# Patient Record
Sex: Male | Born: 1970 | Race: White | Hispanic: Yes | Marital: Single | Smoking: Never smoker
Health system: Southern US, Community
[De-identification: ages and names within clinical notes are randomized; demographics above are authoritative.]

---

## 2002-04-28 ENCOUNTER — Emergency Department (HOSPITAL_COMMUNITY): Admission: EM | Admit: 2002-04-28 | Discharge: 2002-04-28 | Payer: Self-pay | Admitting: Emergency Medicine

## 2004-08-09 ENCOUNTER — Ambulatory Visit (HOSPITAL_COMMUNITY): Admission: RE | Admit: 2004-08-09 | Discharge: 2004-08-09 | Payer: Self-pay | Admitting: Internal Medicine

## 2005-08-22 ENCOUNTER — Ambulatory Visit (HOSPITAL_COMMUNITY): Admission: RE | Admit: 2005-08-22 | Discharge: 2005-08-22 | Payer: Self-pay | Admitting: Internal Medicine

## 2005-08-24 ENCOUNTER — Ambulatory Visit: Payer: Self-pay | Admitting: Gastroenterology

## 2007-07-04 ENCOUNTER — Ambulatory Visit (HOSPITAL_COMMUNITY): Admission: RE | Admit: 2007-07-04 | Discharge: 2007-07-04 | Payer: Self-pay | Admitting: Family Medicine

## 2007-07-22 ENCOUNTER — Ambulatory Visit (HOSPITAL_COMMUNITY): Admission: RE | Admit: 2007-07-22 | Discharge: 2007-07-22 | Payer: Self-pay | Admitting: Urology

## 2007-08-05 ENCOUNTER — Ambulatory Visit (HOSPITAL_COMMUNITY): Admission: RE | Admit: 2007-08-05 | Discharge: 2007-08-05 | Payer: Self-pay | Admitting: Urology

## 2007-08-17 IMAGING — CT CT PELVIS W/ CM
2 of 5 series · 13 of 32 positions shown, 18 images · IV contrast (ORAL OMNI 350 & 100 ML OMNI 300)
Comparison: None.

ABDOMEN CT WITH CONTRAST

CLINICAL DATA: Periumbilical abdominal pain for the past 20 days. Clinical
concern for appendicitis.
TECHNIQUE: Multidetector CT imaging of the abdomen and pelvis was performed
following the standard protocol during bolus administration of intravenous
contrast.

Contrast:  100 cc Omnipaque 300

[Series 2: routine abdomen · axial · 0.70mm/px · z∈[-401,-101]mm · 5 of 91 slices shown, 10 images]
[im 16/91  soft-tissue]
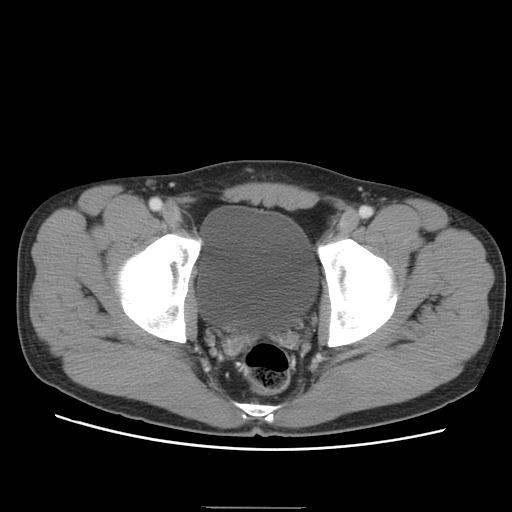
[im 16/91  bone]
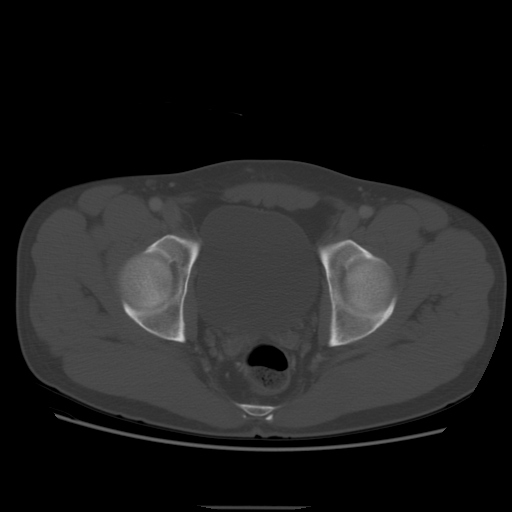
[im 31/91  soft-tissue]
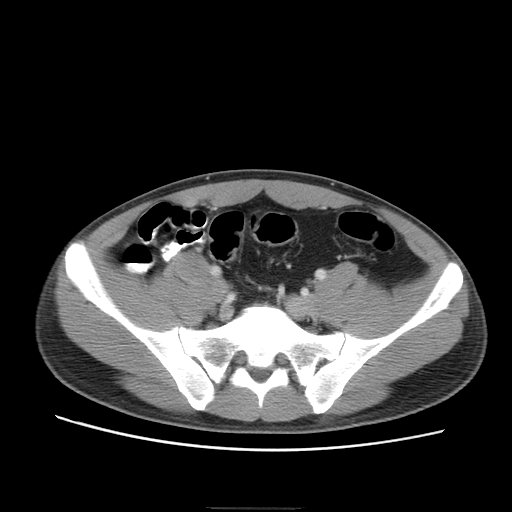
[im 31/91  lung]
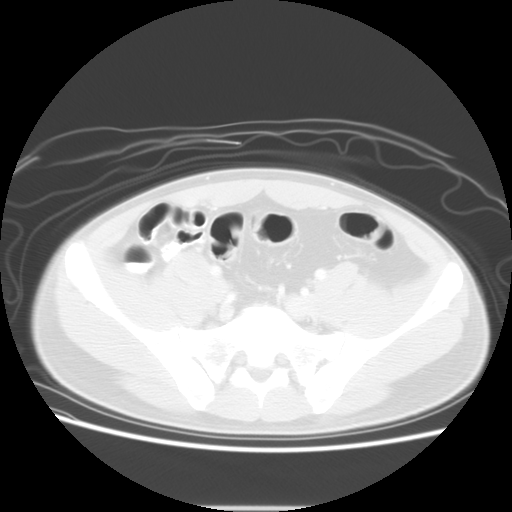
[im 46/91  soft-tissue]
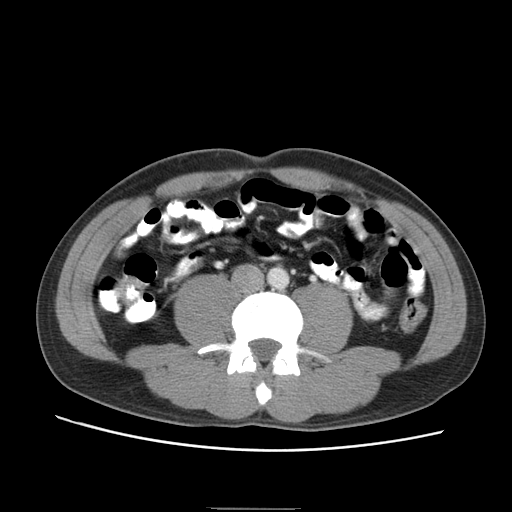
[im 46/91  lung]
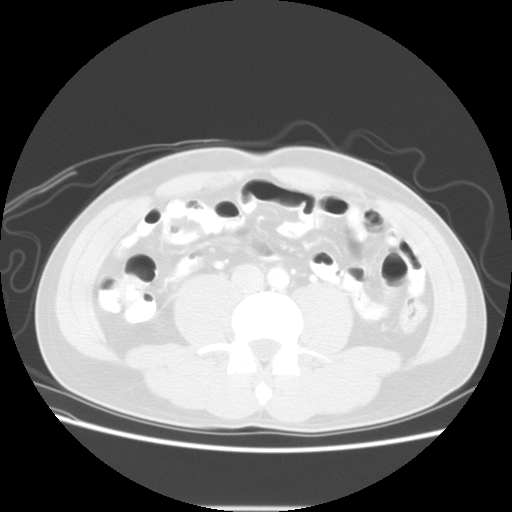
[im 61/91  soft-tissue]
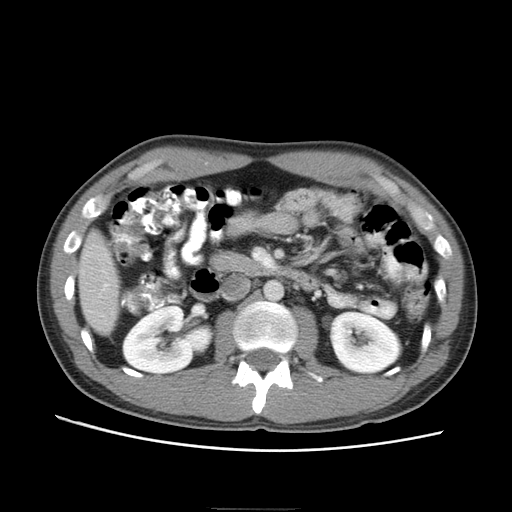
[im 61/91  lung]
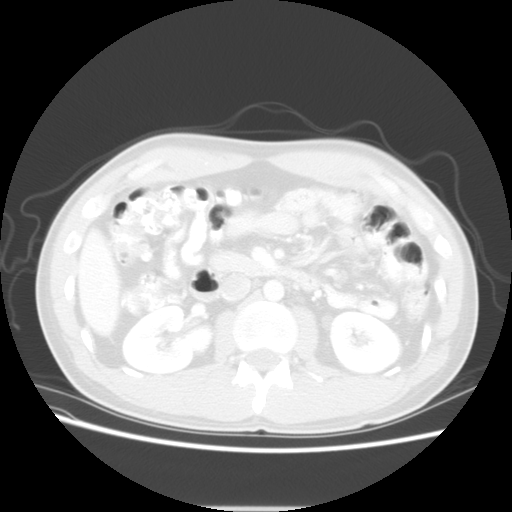
[im 76/91  soft-tissue]
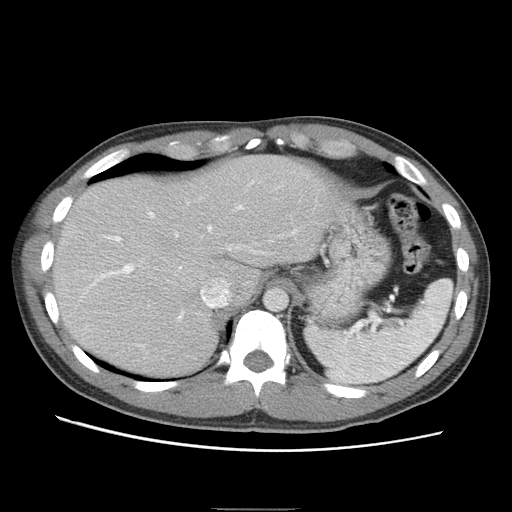
[im 76/91  lung]
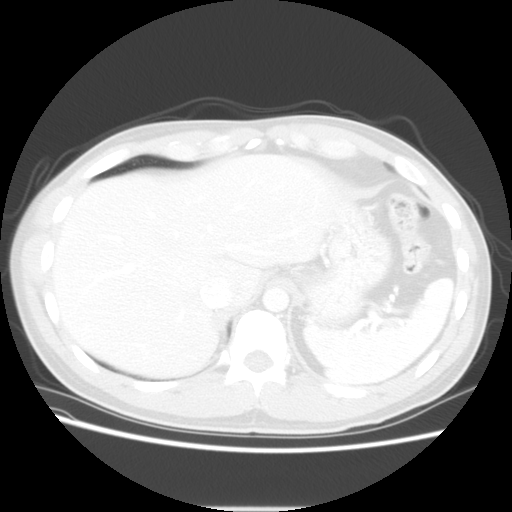

[Series 400: reformatted · sagittal · 0.92mm/px · 8 of 147 slices shown]
[im 14/147  soft-tissue]
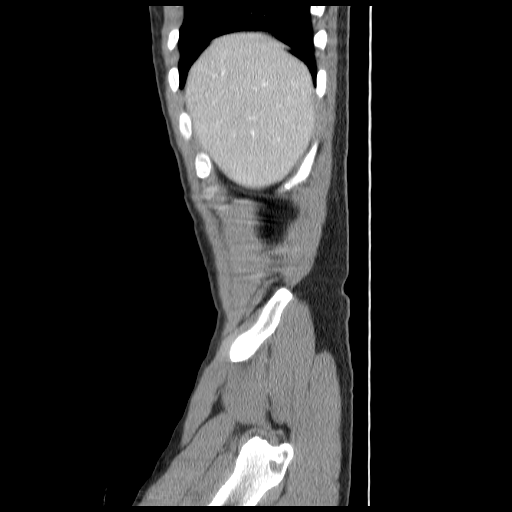
[im 27/147  soft-tissue]
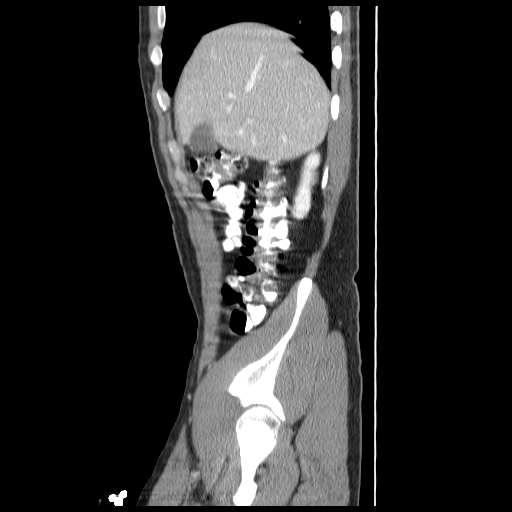
[im 54/147  soft-tissue]
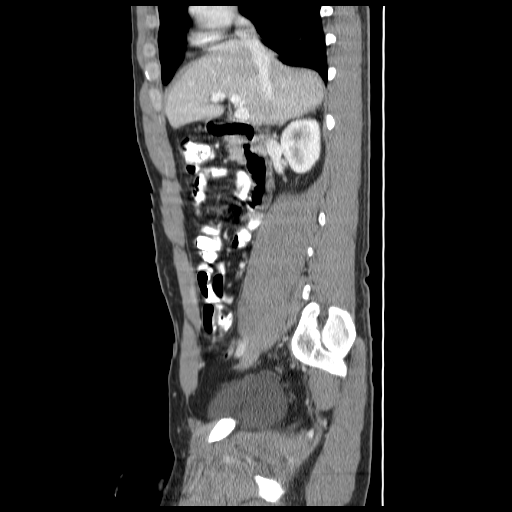
[im 67/147  soft-tissue]
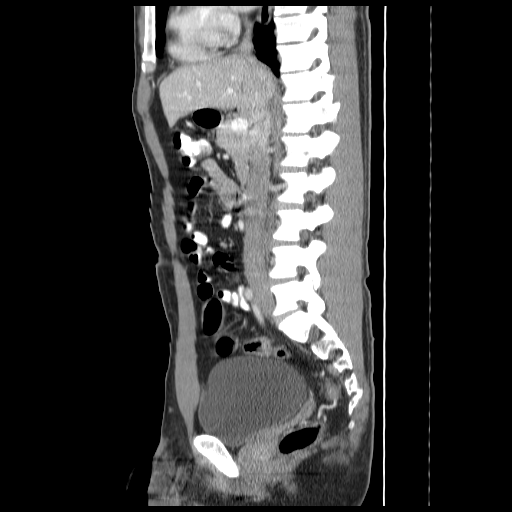
[im 80/147  soft-tissue]
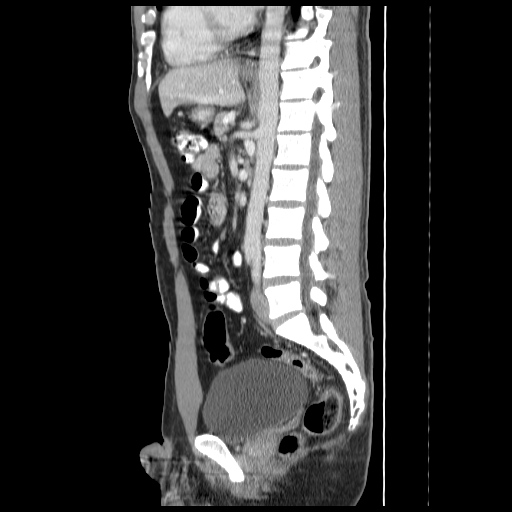
[im 93/147  soft-tissue]
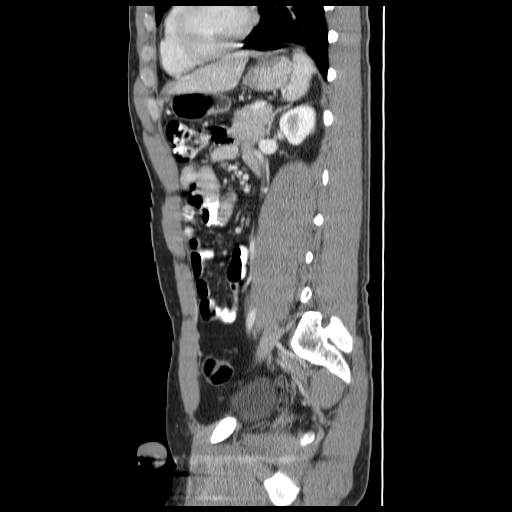
[im 120/147  soft-tissue]
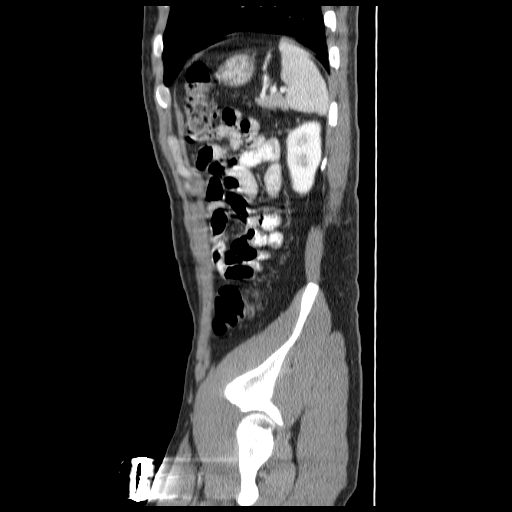
[im 133/147  soft-tissue]
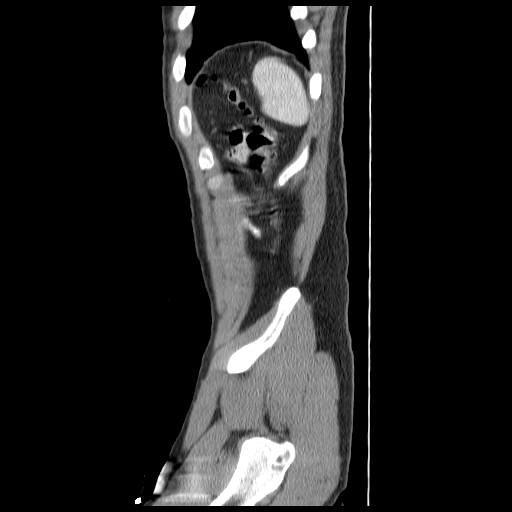

[13 of 32 positions shown; findings below may reference images not displayed]

FINDINGS: Normal appearing liver, spleen, pancreas, gallbladder, kidneys and
adrenal glands. No gastrointestinal abnormalities or enlarged lymph nodes. No
hernia seen. Clear lung bases. Unremarkable bones.

IMPRESSION

Normal examination.

PELVIS CT WITH CONTRAST
FINDINGS: Normal appearing and normally opacified appendix in the upper right
pelvis. No evidence of appendicitis. Unremarkable colon and small bowel loops.
Normal appearing urinary bladder. No free peritoneal fluid or bony
abnormalities.

IMPRESSION

Normal examination.

## 2009-06-28 IMAGING — US US SCROTUM
1 series · 13 of 25 positions shown · non-contrast
Comparison: 08/09/2004

CLINICAL DATA: Right testicular pain

SCROTAL ULTRASOUND
DOPPLER ULTRASOUND OF THE TESTICLES
TECHNIQUE: Complete ultrasound examination of the testicles,
epididymis, and other scrotal structures was performed.  Color and
spectral Doppler ultrasound were also utilized to evaluate blood
flow to the testicles.

[Series 1: unknown · 0.07mm/px · 13 of 67 slices shown]
[im 1/67]
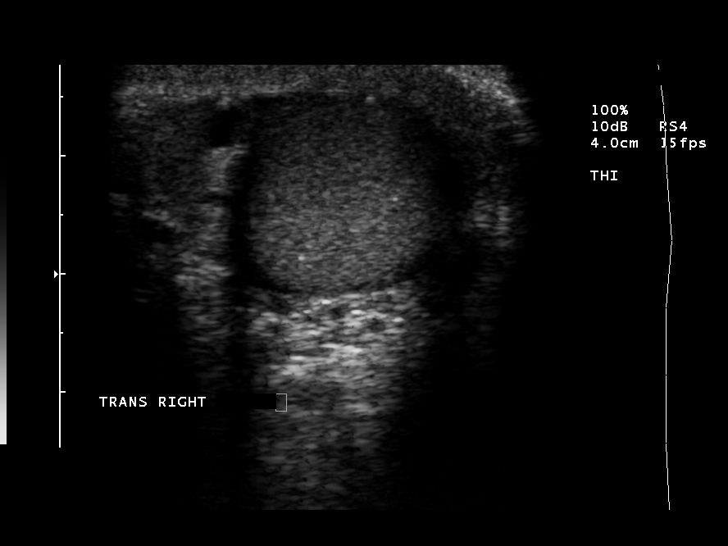
[im 6/67]
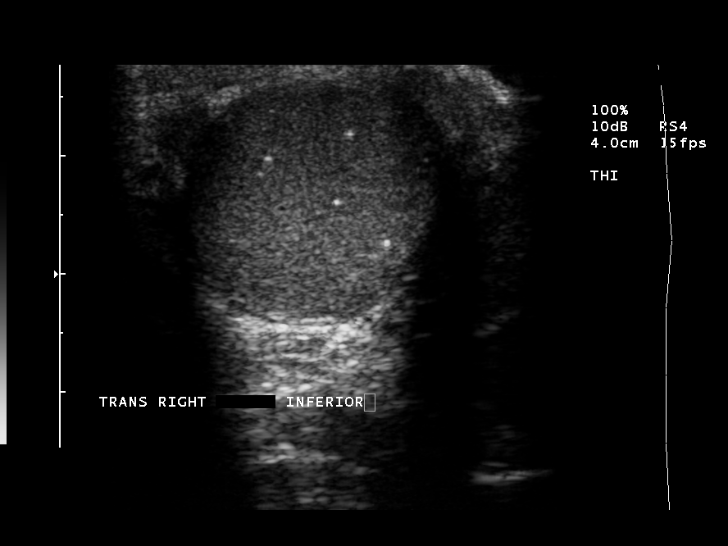
[im 12/67]
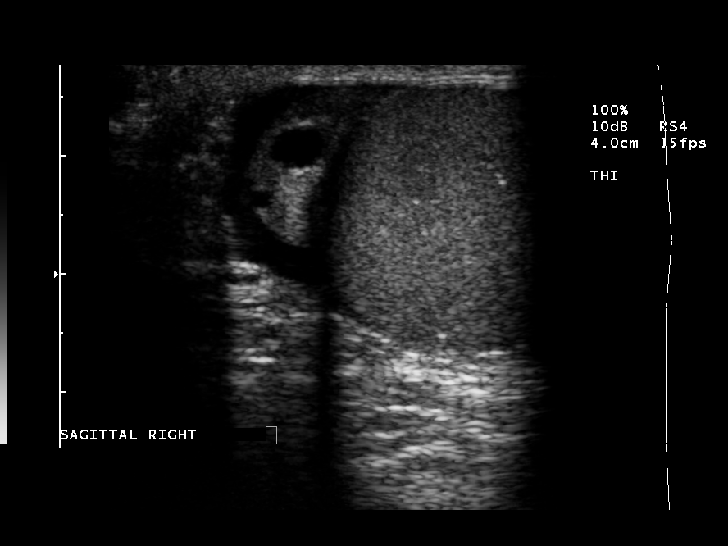
[im 17/67]
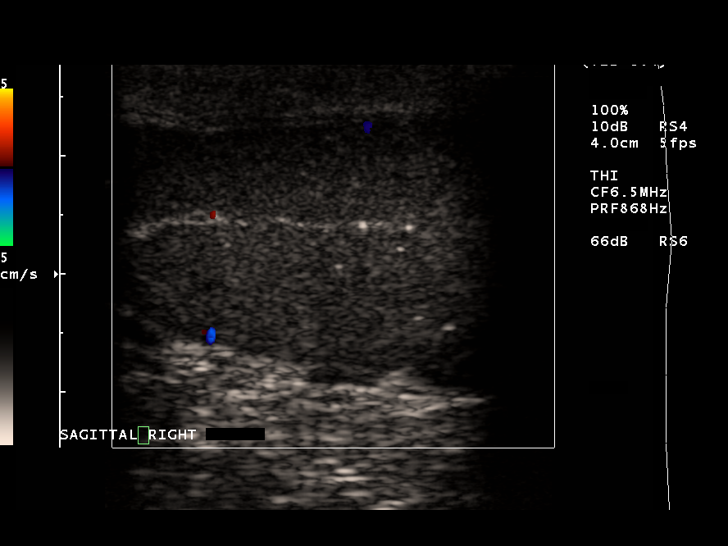
[im 23/67]
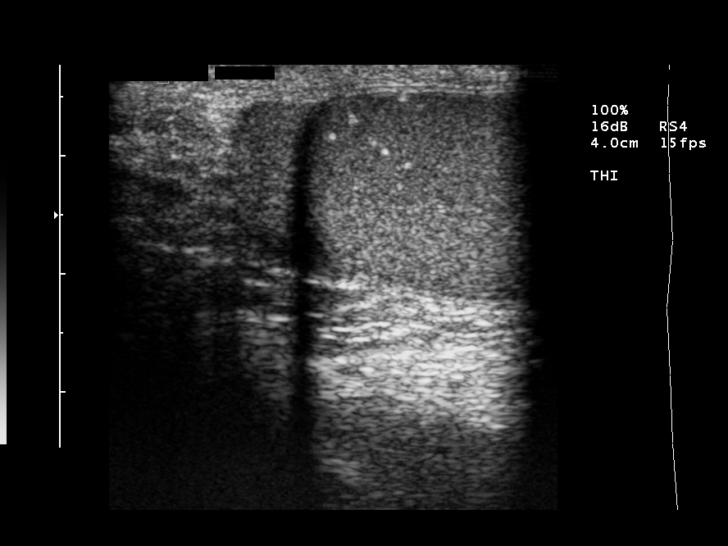
[im 28/67]
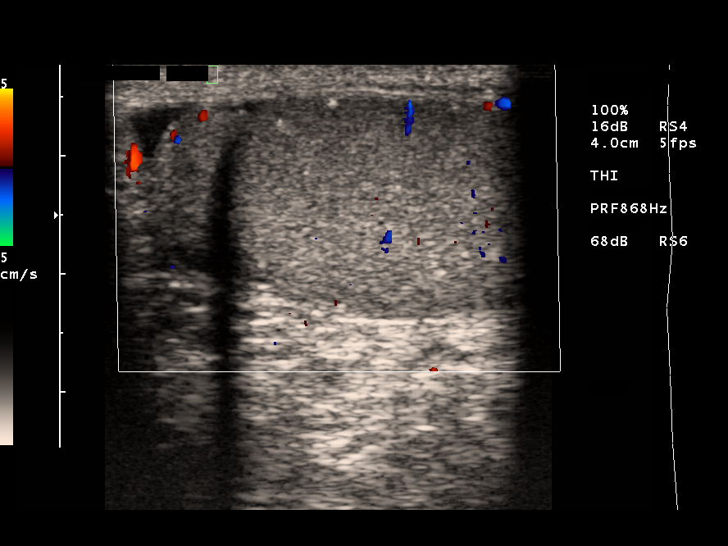
[im 34/67]
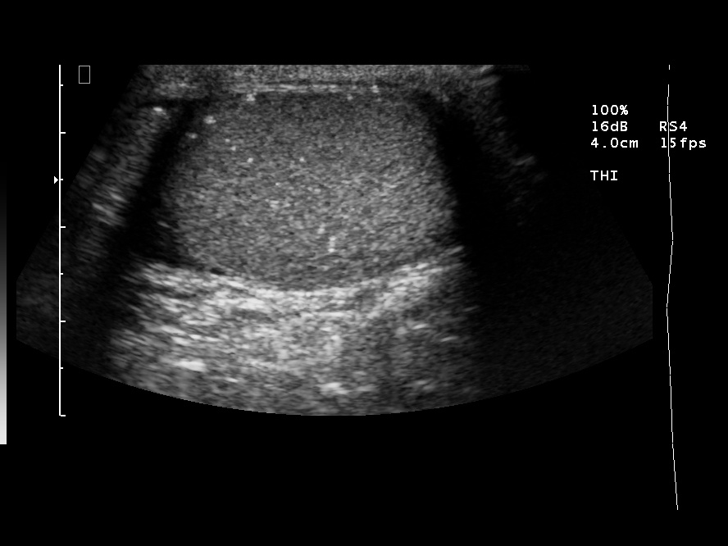
[im 39/67]
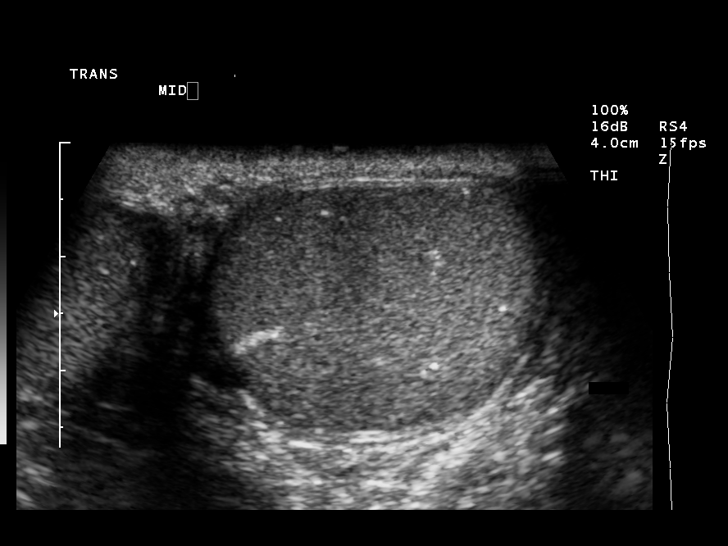
[im 45/67]
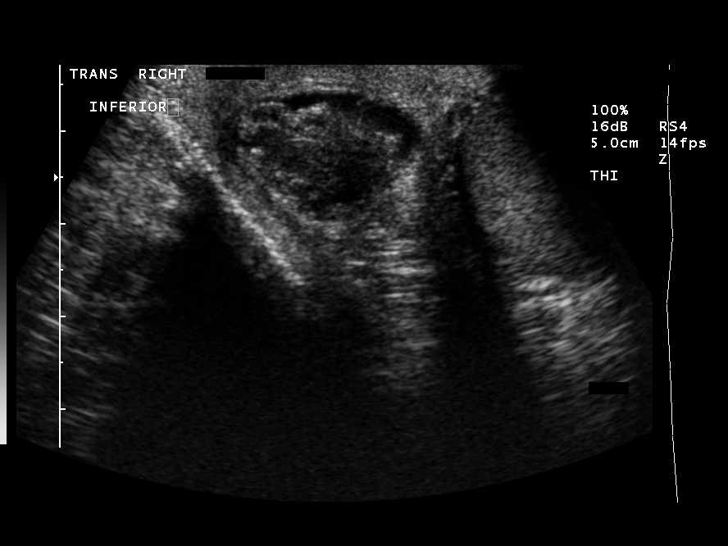
[im 50/67]
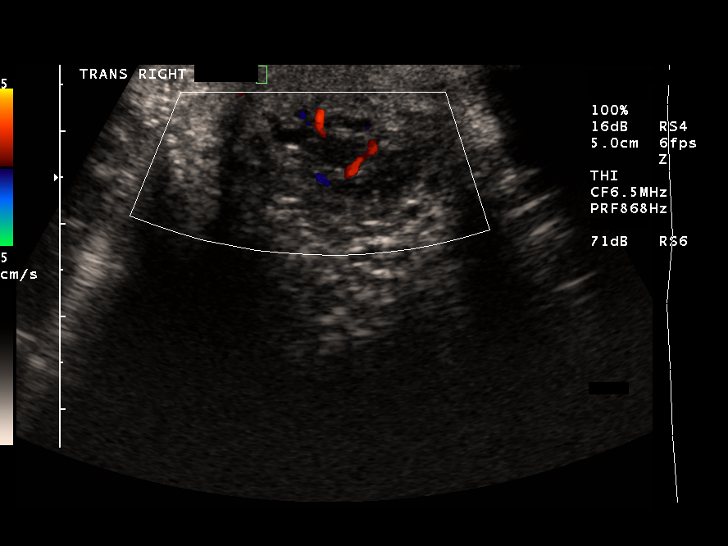
[im 56/67]
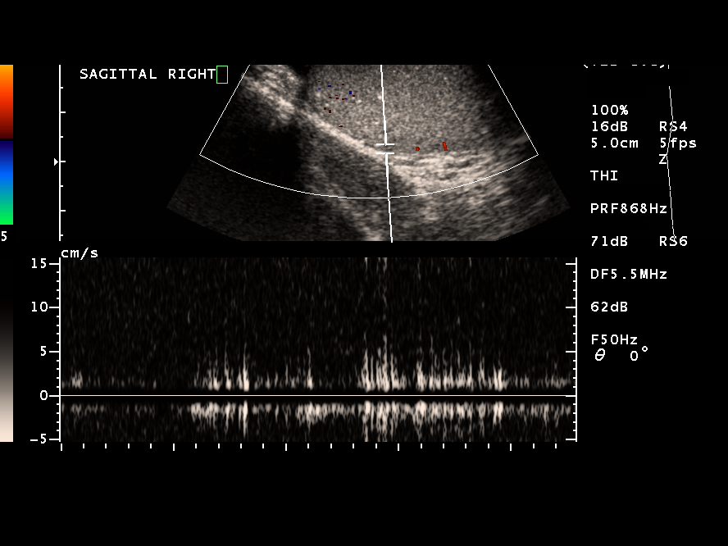
[im 61/67]
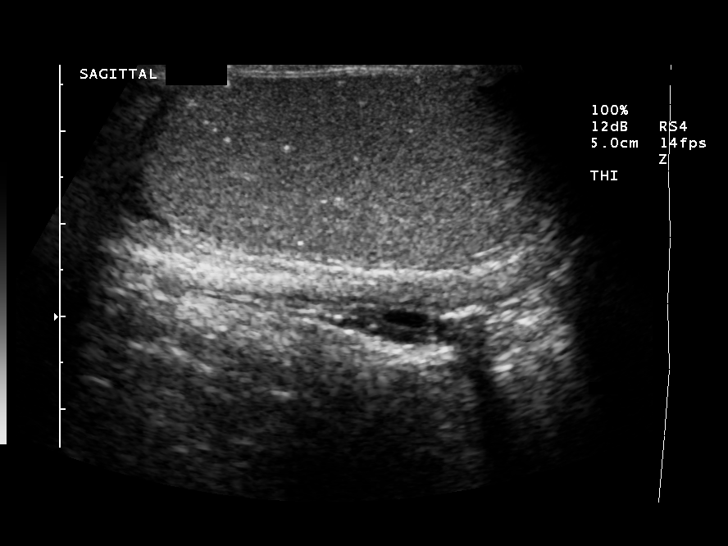
[im 67/67]
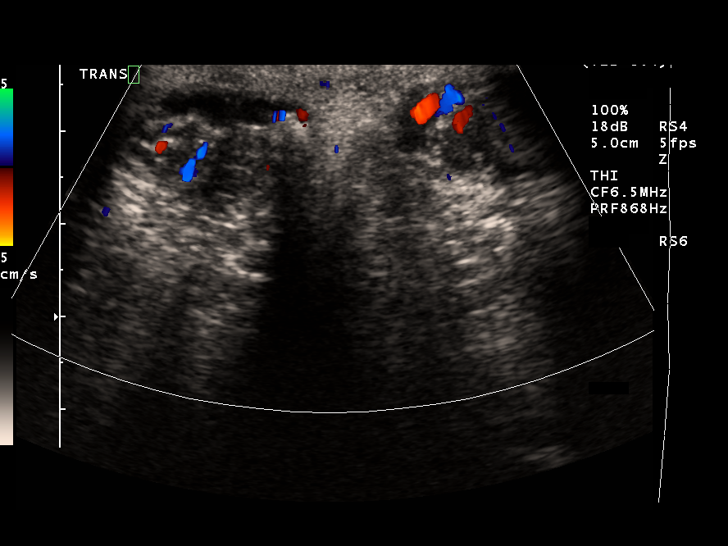

[13 of 25 positions shown; findings below may reference images not displayed]

FINDINGS: Right testis measures 3.4 x 2.5 x 2.7 cm.
Left testis measures 4.0 x 2.3 x 2.5 cm.
Scattered echogenic foci throughout both testes compatible with
microlithiasis.
Epididymis appears normal in echogenicity bilaterally, with a 6 mm
cyst seen in right epididymis and 3 mm cyst seen in left
epididymis.
Small bilateral hydroceles, slightly larger on right.
No intratesticular mass or abnormal echogenicity otherwise
identified.
Arterial and venous waveforms present in both testes on color
Doppler imaging.
Symmetric intratesticular blood flow on color Doppler imaging.
No evidence of testicular torsion or hyperemia.
Question right inguinal hernia raised, with abnormal echogenicity
seen at right inguinal canal.
No definite varicocele.
IMPRESSION: No evidence of testicular mass or torsion, nor hyperemia to suggest
acute inflammatory process.
Diffuse bilateral testicular microlithiasis, placing the patient at
increased risk for testicular neoplasm.
Routine surveillance imaging of the testes recommended at 6 to 12-
month intervals.
Question right inguinal hernia.

## 2009-07-16 IMAGING — US US RENAL
1 series · 14 of 25 positions shown · non-contrast
Comparison: None

CLINICAL DATA: Right testicular pain

RENAL/URINARY TRACT ULTRASOUND
TECHNIQUE: Complete ultrasound examination of the urinary tract
was performed including evaluation of the kidneys renal collecting
systems and urinary bladder.

[Series 1: us renal · 0.30mm/px · 14 of 42 slices shown]
[im 1/42]
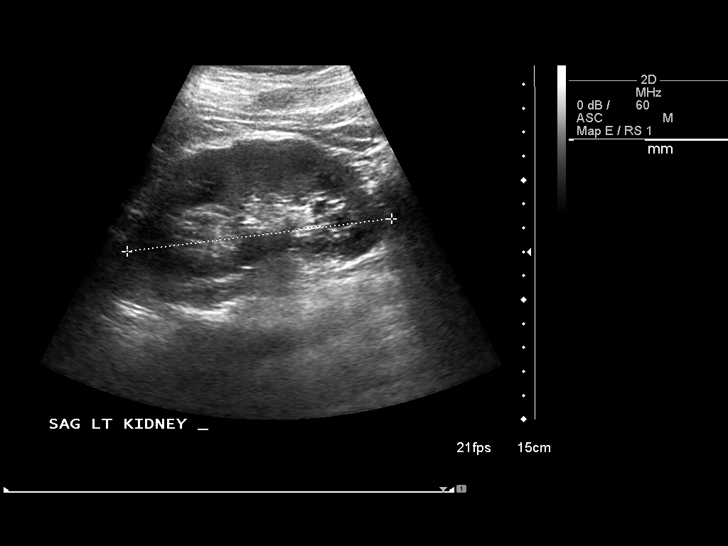
[im 4/42]
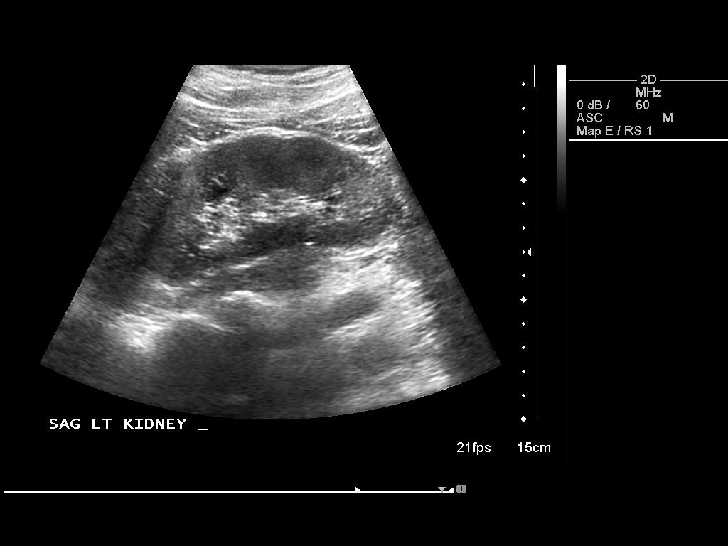
[im 7/42]
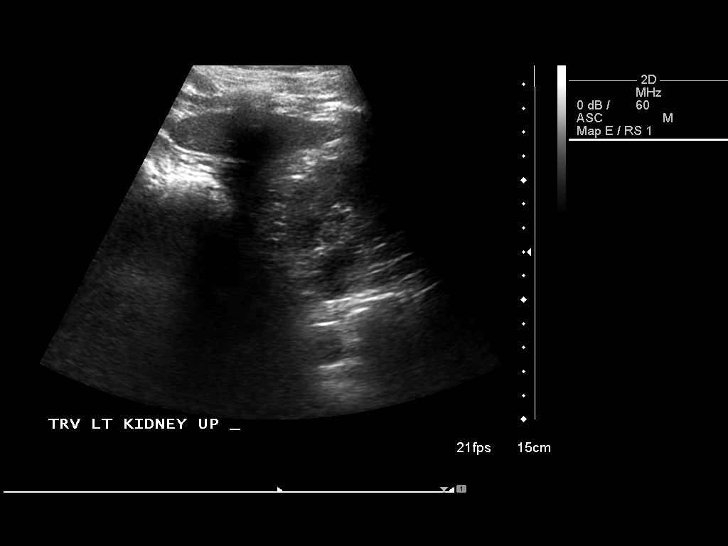
[im 11/42]
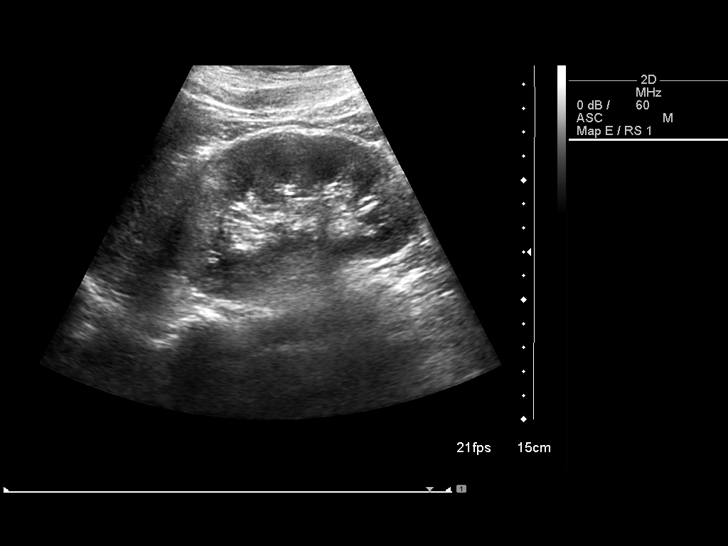
[im 14/42]
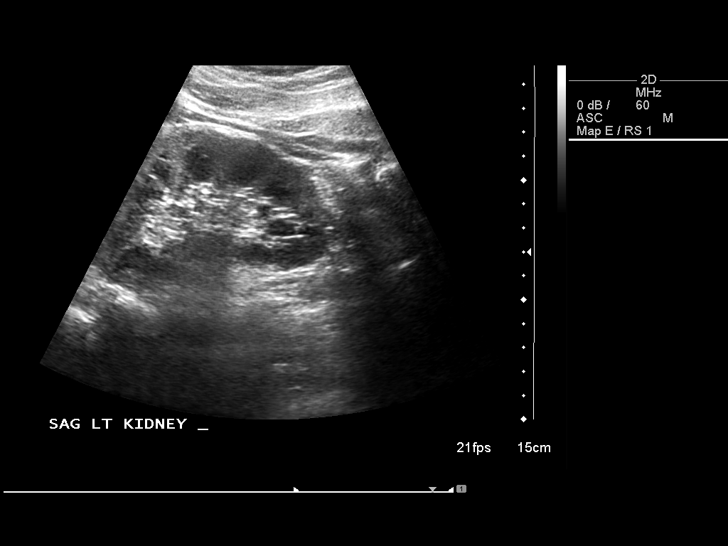
[im 16/42]
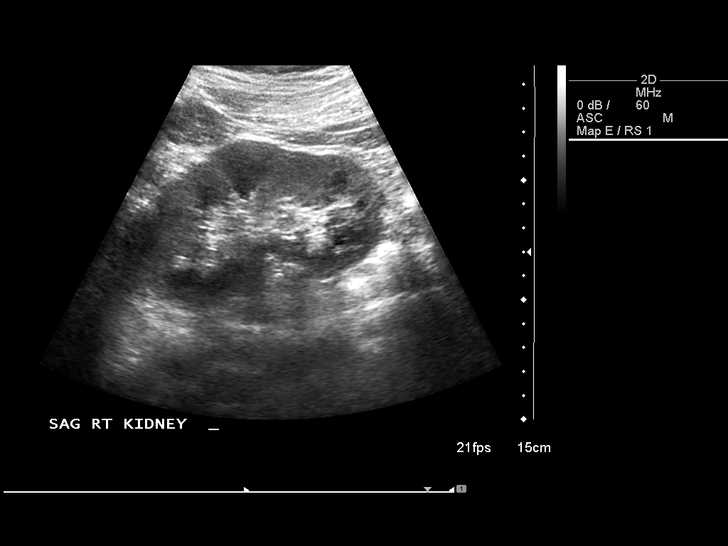
[im 19/42]
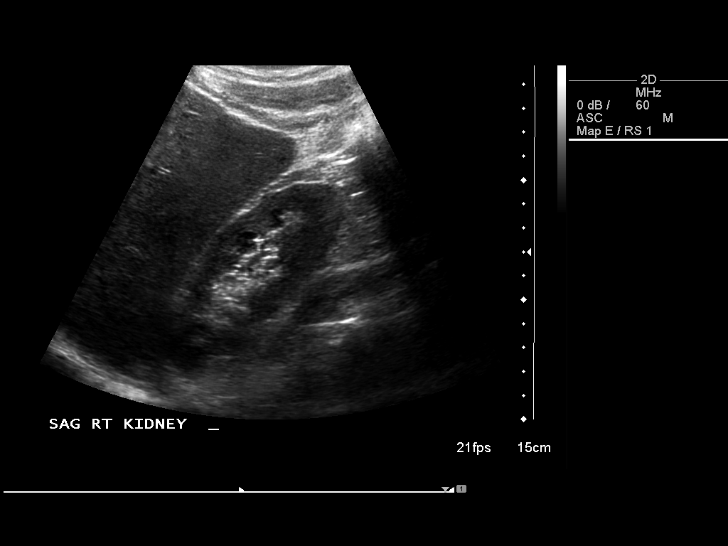
[im 23/42]
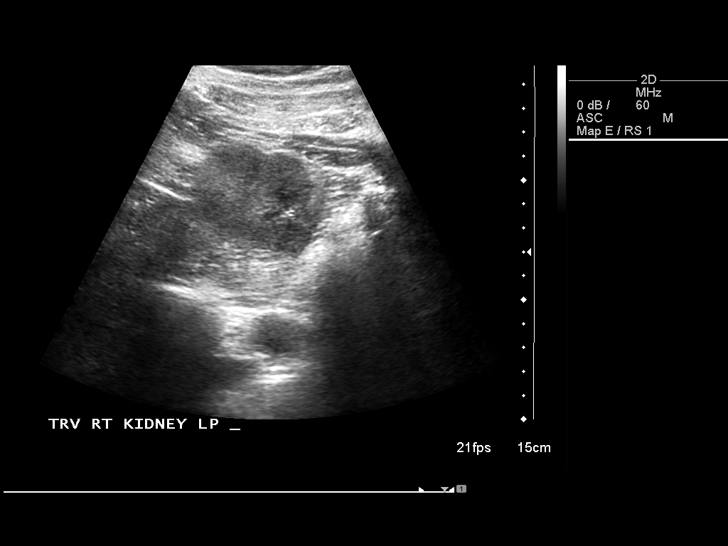
[im 26/42]
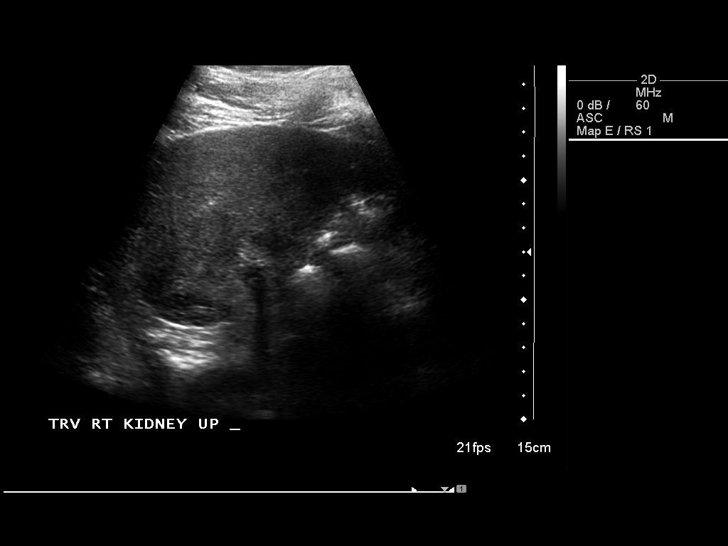
[im 28/42]
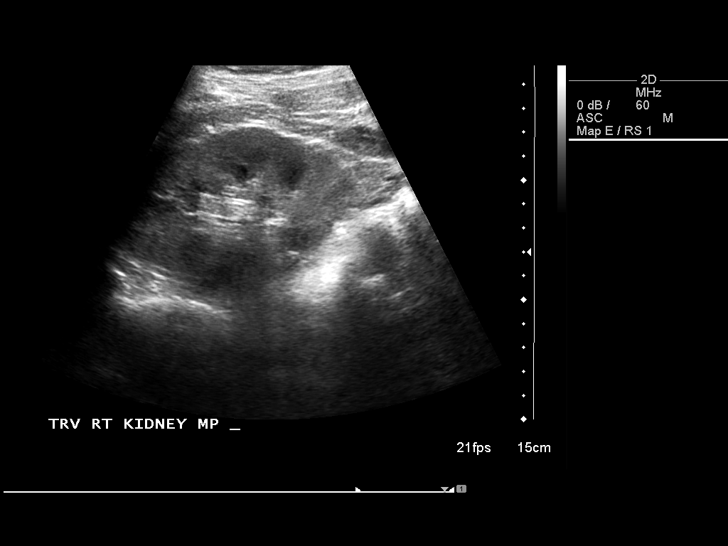
[im 31/42]
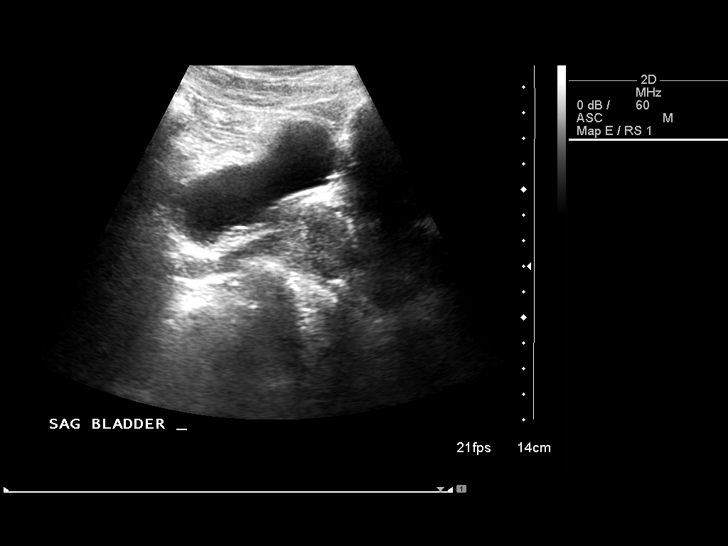
[im 35/42]
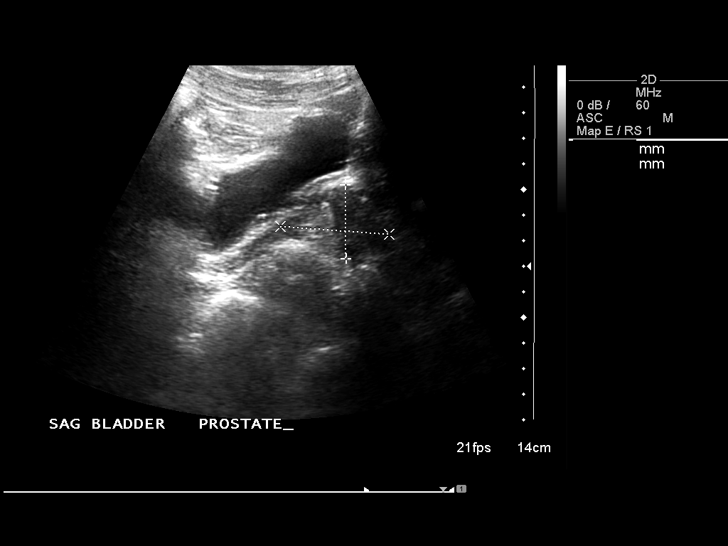
[im 38/42]
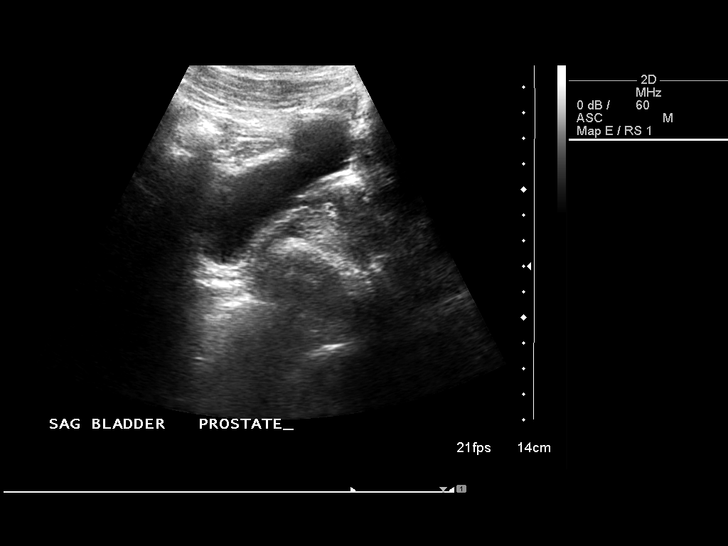
[im 42/42]
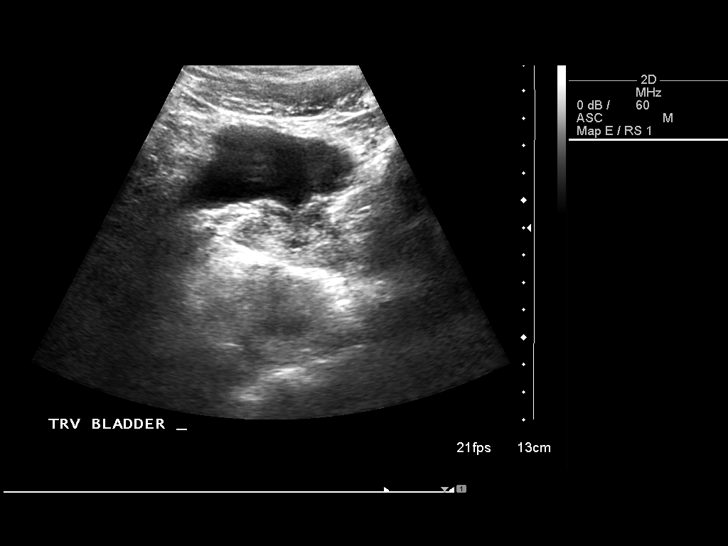

[14 of 25 positions shown; findings below may reference images not displayed]

FINDINGS: Kidneys normal in size at 11.4 cm length right and 11.1 cm length
left.
No renal mass, hydronephrosis, or shadowing calcification.
Question minimal increase in renal cortical echogenicity versus
medullary pyramids bilaterally, equivocal for presence of medical
renal disease.
Urinary bladder is partially distended without gross abnormality.
Prostate gland appears mildly enlarged for age, approximately 4.0 x
4.3 x 2.9 cm.
IMPRESSION: Unremarkable renal ultrasound.
Question minimally increased renal cortical echogenicity, which
could be seen with medical renal disease, recommend correlation
with renal function.

## 2009-11-09 ENCOUNTER — Emergency Department (HOSPITAL_COMMUNITY)
Admission: EM | Admit: 2009-11-09 | Discharge: 2009-11-09 | Payer: Self-pay | Source: Home / Self Care | Admitting: Emergency Medicine

## 2009-11-17 ENCOUNTER — Emergency Department (HOSPITAL_COMMUNITY)
Admission: EM | Admit: 2009-11-17 | Discharge: 2009-11-17 | Payer: Self-pay | Source: Home / Self Care | Admitting: Emergency Medicine

## 2010-03-05 ENCOUNTER — Encounter: Payer: Self-pay | Admitting: Urology

## 2010-03-05 ENCOUNTER — Encounter: Payer: Self-pay | Admitting: Family Medicine

## 2010-03-05 ENCOUNTER — Encounter: Payer: Self-pay | Admitting: Internal Medicine

## 2010-06-27 NOTE — Consult Note (Signed)
NAME:  NOX, TALENT            ACCOUNT NO.:  [id number]   MEDICAL RECORD NO.:  [id number]          PATIENT TYPE:  AMB   LOCATION:  DAY                           FACILITY:  [facility]   PHYSICIAN:  Ky Barban, M.D.DATE OF BIRTH:  March 02, 1970   DATE OF CONSULTATION:  DATE OF DISCHARGE:                                 CONSULTATION   CHIEF COMPLAINT:  Pain in right testicle and suprapubic and lower back  discomfort.   HISTORY:  A 40 year old gentleman who is a very poor historian is  complaining of some nonspecific complaint in his right testicle and  suprapubic area, lower backache.  Denies any fever, chills, gross  hematuria or voiding difficulty.  Testicle ultrasound is essentially  negative.  Renal ultrasound unremarkable.  Renal ultrasound,  questionable minimally increased renal cortical echogenicity.  Question  of medical renal disease.  It has been going on for a while.   PAST HISTORY:  No history of diabetes or hypertension   PERSONAL HISTORY:  Smokes sometimes and drinks sometimes, not all the  time.   REVIEW OF SYSTEMS:  Unremarkable.   PHYSICAL EXAMINATION:  VITAL SIGNS:  Blood pressure 125/82, temperature  of 96.8.  CENTRAL NERVOUS SYSTEM:  Negative.  HEAD/NECK/ENT:  Negative.  CHEST:  Symmetrical.  HEART:  Regular sinus rhythm, no murmurs.  ABDOMEN:  Soft, flat.  Liver, spleen, kidneys are not palpable.  No CVA  tenderness.  EXTERNAL GENITALIA:  Uncircumcised.  Meatus adequate.  Testicles are  normal.  RECTAL:  Normal sphincter tone.  No rectal mass.  Prostate 1+, smooth  and firm.   IMPRESSION:  Suprapubic discomfort and pain right testicle.   PLAN:  Cystoscopy under anesthesia as an outpatient.      Ky Barban, M.D.  Electronically Signed     MIJ/MEDQ  D:  08/04/2007  T:  08/04/2007  Job:  [id number]

## 2010-06-27 NOTE — Op Note (Signed)
NAME:  Tony Vazquez, Tony Vazquez            ACCOUNT NO.:  [id number]   MEDICAL RECORD NO.:  [id number]          PATIENT TYPE:  AMB   LOCATION:  DAY                           FACILITY:  [facility]   PHYSICIAN:  Ky Barban, M.D.DATE OF BIRTH:  1971/01/02   DATE OF PROCEDURE:  DATE OF DISCHARGE:                               OPERATIVE REPORT   PREOPERATIVE DIAGNOSIS:  Suprapubic pain, right testicular pain.   POSTOPERATIVE DIAGNOSIS:  Normal cystoscopy.   PROCEDURE:  Cystoscopy.   PROCEDURE:  The patient is placed in lithotomy position, after usual  prep and drape, #25 cystoscope was introduced under direct vision.  Anterior urethra looks normal and posterior  urethra is open.  Bladder  is smooth.  No tumor, stone, foreign body, or inflammation.  Both  ureteral orifices located at normal side and they are normal in shape.  Cystoscope was removed.  Bimanual pelvic exam is done and it is  unremarkable.  The patient left the operating room in satisfactory  condition.      Ky Barban, M.D.  Electronically Signed     MIJ/MEDQ  D:  08/05/2007  T:  08/05/2007  Job:  [id number]

## 2010-06-30 NOTE — Consult Note (Signed)
NAME:  Tony Vazquez, Tony Vazquez            ACCOUNT NO.:  [id number]   MEDICAL RECORD NO.:  [id number]          PATIENT TYPE:  AMB   LOCATION:  DAY                           FACILITY:  [facility]   PHYSICIAN:  Kassie Mends, M.D.      DATE OF BIRTH:  1970-08-25   DATE OF CONSULTATION:  DATE OF DISCHARGE:                                   CONSULTATION   Dear Dr. Wende Crease:   I am seeing Ardyth Harps as a new patient consultation per your request.  I am  seeing him for abdominal pain.   Mr. Tanney is a 40 year old male who has been experiencing pain in his  epigastrium.  He had diarrheal once.  He denies any vomiting, blood in  stool.  It was the sensation of a cramping, burning pain.  He reports losing  6 pounds over the last  weeks.  He denies any black stool or recent NSAID  use.  He was seen in [facility] for his abdominal pain and a CT scan was  performed on July11.  The CT scan showed no acute intra-abdominal pathology.   He has no significant past medical history.  He has no significant surgical  history.  The is not allergic to any medicines.  He is currently taking omeprazole once a day.  He has no family history of  colon cancer or colon polyps.  His sister had her gallbladder removed, he  believes.He is originally from [place].  He has been in the [place] for  7 years and lives with his girlfriend.  He is a Hospital doctor for [employer] .  He pays cash for medicines.  He 1 or 2 beers per month.  His cell  phone is [phone].  Review of systems as per the HPI.  Otherwise all systems  were negative.   Physical examination:  Vital signs:  Weight 151 pounds, height 5 feet 7  inches, body mass index 23.6.  temperature 98.2, blood pressure 120/80,  pulse 64.  General:  He is in no apparent distress, alert and oriented x4.  HEENT:  Atraumatic normocephalic.  Pupils equal and reactive to light.  Mouth:  No oral lesions.  Posterior pharynx without erythema or exudate.  Lungs:  Clear to  auscultation bilaterally.  Cardiovascular:  Regular rhythm,  no murmur, normal S1 and S2.  Abdomen:  Bowel sounds present, soft,  nontender, nondistended.  No rebound or guarding.  Extremities:  Without  cyanosis, clubbing or edema.  Neurologic:  He has no focal neurologic  deficits.   Mr. Brandi is a 40 year old [place] immigrant with epigastric pain.  The  differential diagnosis includes Helicobacter pylori gastritis and a low  likelihood of malignancy.  Thank you for allowing me to see Mr. Mckesson in  consultation.  My list of recommendations follows.   1.  Recommend H. pylori be drawn serology and CBC.  We will arrange for      those to be done today.  2.  He should take a PPI b.i.d..  He is given Protonix #30 in addition to      his 30 omeprazole  that he already has.  He is instructed to take it      twice a day.  He may use Maalox, Mylanta or Tums as needed for pain.  He      is given a prescription for Vicodin 5/500 mg #15, half, one p.o. every 6      hours as needed for pain.  Medication warnings were given.  He was      cautioned not to drive and use the Vicodin.  He was also cautioned not      to use Tylenol and      Vicodin together.  3.  It he will follow-up in 2 months.  If his pain is not better, then I      will perform a EGD.   Please feel free to contact me with additional questions at [phone].      Kassie Mends, M.D.  Electronically Signed     SM/MEDQ  D:  08/24/2005  T:  08/24/2005  Job:  [id number]   cc:   Laverle Hobby, M.D.  [address]  [place], [place] [address]

## 2010-11-09 LAB — URINALYSIS, ROUTINE W REFLEX MICROSCOPIC
Bilirubin Urine: NEGATIVE
Nitrite: NEGATIVE

## 2010-11-09 LAB — HEMOGLOBIN AND HEMATOCRIT, BLOOD: Hemoglobin: 15.9

## 2019-02-11 ENCOUNTER — Encounter: Payer: Self-pay | Admitting: Urology

## 2019-02-11 ENCOUNTER — Ambulatory Visit (INDEPENDENT_AMBULATORY_CARE_PROVIDER_SITE_OTHER): Payer: Self-pay | Admitting: Urology

## 2019-02-11 ENCOUNTER — Other Ambulatory Visit: Payer: Self-pay

## 2019-02-11 VITALS — BP 117/75 | HR 73 | Temp 97.9°F | Ht 66.0 in | Wt 160.0 lb

## 2019-02-11 DIAGNOSIS — N400 Enlarged prostate without lower urinary tract symptoms: Secondary | ICD-10-CM

## 2019-02-11 LAB — POCT URINALYSIS DIPSTICK
Bilirubin, UA: NEGATIVE
Blood, UA: POSITIVE
Glucose, UA: NEGATIVE
Ketones, UA: NEGATIVE
Leukocytes, UA: NEGATIVE
Nitrite, UA: NEGATIVE
Protein, UA: NEGATIVE
Spec Grav, UA: 1.02 (ref 1.010–1.025)
Urobilinogen, UA: NEGATIVE E.U./dL — AB
pH, UA: 5 (ref 5.0–8.0)

## 2019-02-11 MED ORDER — ALFUZOSIN HCL ER 10 MG PO TB24: 10.0000 mg | ORAL_TABLET | Freq: Every day | ORAL | 11 refills | Status: DC

## 2019-02-11 NOTE — Progress Notes (Signed)
02/11/2019 1:58 PM   Tony Vazquez 1970/07/02 [id number]  Referring provider: No referring provider defined for this encounter.  Urinary frequency  HPI: Tony Vazquez is a 48yo with no significant PMH who presents today for worsening urinary frequency and nocturia. Over the past 4 months he has noted nocturia 3-5x, urinary frequency every 1-2 hours, suprapubic pressure. Stream is strong. No dysuria or hematuria. NO other associated symptoms. No exacerbating/alleviaitng events. He has never been on any prostate medications   PMH: No past medical history on file.  Surgical History:   Home Medications:  Allergies as of 02/11/2019   No Known Allergies     Medication List       Accurate as of February 11, 2019  1:58 PM. If you have any questions, ask your nurse or doctor.        ibuprofen 200 MG tablet Commonly known as: ADVIL Take 200 mg by mouth every 6 (six) hours as needed.       Allergies: No Known Allergies  Family History: Family History  Problem Relation Age of Onset  . Benign prostatic hyperplasia Father     Social History:  reports that he has never smoked. He has never used smokeless tobacco. He reports previous alcohol use. No history on file for drug.  ROS:   Urological Symptom Review  Patient is experiencing the following symptoms: Frequent urination Hard to postpone urination Get up at night to urinate Stream starts and stops   Review of Systems  Gastrointestinal (upper)  : Negative for upper GI symptoms  Gastrointestinal (lower) : Negative for lower GI symptoms  Constitutional : Negative for symptoms  Skin: Negative for skin symptoms  Eyes: Negative for eye symptoms  Ear/Nose/Throat : Negative for Ear/Nose/Throat symptoms  Hematologic/Lymphatic: Negative for Hematologic/Lymphatic symptoms  Cardiovascular : Negative for cardiovascular symptoms  Respiratory : Negative for respiratory symptoms  Endocrine:  Negative for endocrine symptoms  Musculoskeletal: Negative for musculoskeletal symptoms  Neurological: Negative for neurological symptoms  Psychologic: Negative for psychiatric symptoms                                       Physical Exam: BP 117/75   Pulse 73   Temp 97.9 F (36.6 C)   Ht 5\' 6"  (1.676 m)   Wt 160 lb (72.6 kg)   BMI 25.82 kg/m   Constitutional:  Alert and oriented, No acute distress. HEENT: [place] AT, moist mucus membranes.  Trachea midline, no masses. Cardiovascular: No clubbing, cyanosis, or edema. Respiratory: Normal respiratory effort, no increased work of breathing. GI: Abdomen is soft, nontender, nondistended, no abdominal masses GU: No CVA tenderness. Uncircumcised. No masses/lesions on penis. Testis normal. No masses/lesions on scrotum. Prostate smooth 35g no nodules Lymph: No cervical or inguinal lymphadenopathy. Skin: No rashes, bruises or suspicious lesions. Neurologic: Grossly intact, no focal deficits, moving all 4 extremities. Psychiatric: Normal mood and affect.  Laboratory Data: Lab Results  Component Value Date   HGB 15.9 08/01/2007   HCT 44.9 08/01/2007    No results found for: CREATININE  No results found for: PSA  No results found for: TESTOSTERONE  No results found for: HGBA1C  Urinalysis    Component Value Date/Time   COLORURINE YELLOW 08/01/2007 1548   APPEARANCEUR CLEAR 08/01/2007 1548   LABSPEC >1.030 (H) 08/01/2007 1548   PHURINE 5.0 08/01/2007 1548   GLUCOSEU NEGATIVE 08/01/2007 1548   HGBUR NEGATIVE  08/01/2007 1548   BILIRUBINUR negative 02/11/2019 [place] 08/01/2007 1548   PROTEINUR Negative 02/11/2019 1352   PROTEINUR NEGATIVE 08/01/2007 1548   UROBILINOGEN negative (A) 02/11/2019 1352   UROBILINOGEN 0.2 08/01/2007 1548   NITRITE negative 02/11/2019 1352   NITRITE NEGATIVE 08/01/2007 1548   LEUKOCYTESUR Negative 02/11/2019 1352    No results found for: LABMICR, WBCUA,  RBCUA, LABEPIT, MUCUS, BACTERIA  Pertinent Imaging:  No results found for this or any previous visit. No results found for this or any previous visit. No results found for this or any previous visit. No results found for this or any previous visit. Results for orders placed during the hospital encounter of 07/22/07  US Renal   Narrative Clinical Data: Right testicular pain   RENAL/URINARY TRACT ULTRASOUND   Technique:  Complete ultrasound examination of the urinary tract was performed including evaluation of the kidneys renal collecting systems and urinary bladder.   Comparison: None   Findings: Kidneys normal in size at 11.4 cm length right and 11.1 cm length left. No renal mass, hydronephrosis, or shadowing calcification. Question minimal increase in renal cortical echogenicity versus medullary pyramids bilaterally, equivocal for presence of medical renal disease. Urinary bladder is partially distended without gross abnormality. Prostate gland appears mildly enlarged for age, approximately 4.0 x 4.3 x 2.9 cm.   IMPRESSION: Unremarkable renal ultrasound. Question minimally increased renal cortical echogenicity, which could be seen with medical renal disease, recommend correlation with renal function.  Provider: Mertie Clause   No results found for this or any previous visit. No results found for this or any previous visit. No results found for this or any previous visit.  Assessment & Plan:    1. Benign prostatic hyperplasia, unspecified whether lower urinary tract symptoms present -fluid management prior to bedtime -uroxatral 10mg  qhs -RTC 6 weeks with PVR  - POCT urinalysis dipstick   No follow-ups on file.  Nicolette Bang, MD  [facility] [place]

## 2019-02-11 NOTE — Patient Instructions (Signed)

## 2019-03-25 ENCOUNTER — Ambulatory Visit: Payer: Self-pay | Admitting: Urology

## 2019-04-01 ENCOUNTER — Other Ambulatory Visit: Payer: Self-pay

## 2019-04-01 ENCOUNTER — Encounter: Payer: Self-pay | Admitting: Urology

## 2019-04-01 ENCOUNTER — Ambulatory Visit (INDEPENDENT_AMBULATORY_CARE_PROVIDER_SITE_OTHER): Payer: No Typology Code available for payment source | Admitting: Urology

## 2019-04-01 VITALS — BP 113/71 | HR 69 | Temp 97.7°F | Ht 67.0 in | Wt 160.0 lb

## 2019-04-01 DIAGNOSIS — R351 Nocturia: Secondary | ICD-10-CM

## 2019-04-01 DIAGNOSIS — N400 Enlarged prostate without lower urinary tract symptoms: Secondary | ICD-10-CM

## 2019-04-01 LAB — BLADDER SCAN AMB NON-IMAGING: Scan Result: 17.7

## 2019-04-01 MED ORDER — MIRABEGRON ER 25 MG PO TB24: 25.0000 mg | ORAL_TABLET | Freq: Every day | ORAL | 11 refills | Status: AC

## 2019-04-01 NOTE — Progress Notes (Signed)
04/01/2019 4:01 PM   Tony Vazquez Jun 19, 1945 [id number]  Referring provider: No referring provider defined for this encounter.  nocturia  HPI: [name] is a 49yo here for followup for nocturia. He was given uroxatral 10mg  last visit which failed to improve his nocturia. He noted improvement in stream on uroxatral. Frequency worsened on uroxatral. No dysuria. after he has urinated at night to does not suprapubic dull pain that lasts 30 seconds. No other associated symptoms   PMH: No past medical history on file.  Surgical History: No past surgical history on file.  Home Medications:  Allergies as of 04/01/2019   No Known Allergies     Medication List       Accurate as of April 01, 2019  4:01 PM. If you have any questions, ask your nurse or doctor.        alfuzosin 10 MG 24 hr tablet Commonly known as: UROXATRAL Take 1 tablet (10 mg total) by mouth daily with breakfast.   clobetasol 0.05 % external solution Commonly known as: TEMOVATE APPLY TO SCALP TWICE DAILY AS NEEDED (NOT TO FACE GROIN AND UNDERARMS)   ibuprofen 200 MG tablet Commonly known as: ADVIL Take 200 mg by mouth every 6 (six) hours as needed.       Allergies: No Known Allergies  Family History: Family History  Problem Relation Age of Onset  . Benign prostatic hyperplasia Father     Social History:  reports that he has never smoked. He has never used smokeless tobacco. He reports previous alcohol use. No history on file for drug.  ROS: All other review of systems were reviewed and are negative except what is noted above in HPI  Physical Exam: BP 113/71   Pulse 69   Temp 97.7 F (36.5 C)   Ht 5\' 7"  (1.702 m)   Wt 160 lb (72.6 kg)   BMI 25.06 kg/m   Constitutional:  Alert and oriented, No acute distress. HEENT: [place] AT, moist mucus membranes.  Trachea midline, no masses. Cardiovascular: No clubbing, cyanosis, or edema. Respiratory: Normal respiratory effort, no increased  work of breathing. GI: Abdomen is soft, nontender, nondistended, no abdominal masses GU: No CVA tenderness Lymph: No cervical or inguinal lymphadenopathy. Skin: No rashes, bruises or suspicious lesions. Neurologic: Grossly intact, no focal deficits, moving all 4 extremities. Psychiatric: Normal mood and affect.  Laboratory Data: Lab Results  Component Value Date   HGB 15.9 08/01/2007   HCT 44.9 08/01/2007    No results found for: CREATININE  No results found for: PSA  No results found for: TESTOSTERONE  No results found for: HGBA1C  Urinalysis    Component Value Date/Time   COLORURINE YELLOW 08/01/2007 1548   APPEARANCEUR CLEAR 08/01/2007 1548   LABSPEC >1.030 (H) 08/01/2007 1548   PHURINE 5.0 08/01/2007 1548   GLUCOSEU NEGATIVE 08/01/2007 1548   HGBUR NEGATIVE 08/01/2007 1548   BILIRUBINUR negative 02/11/2019 1352   KETONESUR NEGATIVE 08/01/2007 1548   PROTEINUR Negative 02/11/2019 1352   PROTEINUR NEGATIVE 08/01/2007 1548   UROBILINOGEN negative (A) 02/11/2019 1352   UROBILINOGEN 0.2 08/01/2007 1548   NITRITE negative 02/11/2019 1352   NITRITE NEGATIVE 08/01/2007 1548   LEUKOCYTESUR Negative 02/11/2019 1352    No results found for: LABMICR, WBCUA, RBCUA, LABEPIT, MUCUS, BACTERIA  Pertinent Imaging:  No results found for this or any previous visit. No results found for this or any previous visit. No results found for this or any previous visit. No results found for this or  any previous visit. Results for orders placed during the hospital encounter of 07/22/07  US Renal   Narrative Clinical Data: Right testicular pain   RENAL/URINARY TRACT ULTRASOUND   Technique:  Complete ultrasound examination of the urinary tract was performed including evaluation of the kidneys renal collecting systems and urinary bladder.   Comparison: None   Findings: Kidneys normal in size at 11.4 cm length right and 11.1 cm length left. No renal mass, hydronephrosis, or  shadowing calcification. Question minimal increase in renal cortical echogenicity versus medullary pyramids bilaterally, equivocal for presence of medical renal disease. Urinary bladder is partially distended without gross abnormality. Prostate gland appears mildly enlarged for age, approximately 4.0 x 4.3 x 2.9 cm.   IMPRESSION: Unremarkable renal ultrasound. Question minimally increased renal cortical echogenicity, which could be seen with medical renal disease, recommend correlation with renal function.  Provider: Mertie Clause   No results found for this or any previous visit. No results found for this or any previous visit. No results found for this or any previous visit.  Assessment & Plan:    1. Benign prostatic hyperplasia, unspecified whether lower urinary tract symptoms present -stop uroxatral, start mirabegron 25mg  daily  2. Nocturia -mirabegron 25mg  daily   No follow-ups on file.  Tony Bang, MD  [facility] [place]

## 2019-04-01 NOTE — Patient Instructions (Signed)

## 2019-04-01 NOTE — Progress Notes (Signed)
Urological Symptom Review  Patient is experiencing the following symptoms: Frequent urination Hard to postpone urination Get up at night to urinate   Review of Systems  Gastrointestinal (upper)  : Negative for upper GI symptoms  Gastrointestinal (lower) : Negative for lower GI symptoms  Constitutional : Negative for symptoms  Skin: Negative for skin symptoms  Eyes: Negative for eye symptoms  Ear/Nose/Throat : Negative for Ear/Nose/Throat symptoms  Hematologic/Lymphatic: Negative for Hematologic/Lymphatic symptoms  Cardiovascular : Negative for cardiovascular symptoms  Respiratory : Negative for respiratory symptoms  Endocrine: Negative for endocrine symptoms  Musculoskeletal: Negative for musculoskeletal symptoms  Neurological: Negative for neurological symptoms  Psychologic: Negative for psychiatric symptoms  

## 2019-04-25 ENCOUNTER — Ambulatory Visit: Payer: No Typology Code available for payment source | Attending: Internal Medicine

## 2019-04-25 DIAGNOSIS — Z23 Encounter for immunization: Secondary | ICD-10-CM

## 2019-04-25 NOTE — Progress Notes (Signed)
   Covid-19 Vaccination Clinic  Name:  Tony Vazquez    MRN: [id number] DOB: 1970-07-06  04/25/2019  Mr. [name] was observed post Covid-19 immunization for 15 minutes without incident. He was provided with Vaccine Information Sheet and instruction to access the V-Safe system.   Mr. [name] was instructed to call 911 with any severe reactions post vaccine: Marland Kitchen Difficulty breathing  . Swelling of face and throat  . A fast heartbeat  . A bad rash all over body  . Dizziness and weakness   Immunizations Administered    Name Date Dose VIS Date Route   Moderna COVID-19 Vaccine 04/25/2019 10:07 AM 0.5 mL 01/13/2019 Intramuscular   Manufacturer: Moderna   Lot: [id number]   NDC: 18288-337-44

## 2019-05-20 ENCOUNTER — Ambulatory Visit: Payer: No Typology Code available for payment source | Admitting: Urology

## 2019-05-27 ENCOUNTER — Ambulatory Visit: Payer: No Typology Code available for payment source | Attending: Internal Medicine

## 2019-05-27 DIAGNOSIS — Z23 Encounter for immunization: Secondary | ICD-10-CM

## 2019-05-27 NOTE — Progress Notes (Signed)
   Covid-19 Vaccination Clinic  Name:  Tony Vazquez    MRN: [id number] DOB: 01/12/1971  05/27/2019  Tony Vazquez was observed post Covid-19 immunization for 15 minutes without incident. He was provided with Vaccine Information Sheet and instruction to access the V-Safe system.   Tony Vazquez was instructed to call 911 with any severe reactions post vaccine: Marland Kitchen Difficulty breathing  . Swelling of face and throat  . A fast heartbeat  . A bad rash all over body  . Dizziness and weakness   Immunizations Administered    Name Date Dose VIS Date Route   Moderna COVID-19 Vaccine 05/27/2019  8:29 AM 0.5 mL 01/13/2019 Intramuscular   Manufacturer: Moderna   Lot: [id number]   NDC: 86148-307-35

## 2019-08-13 ENCOUNTER — Other Ambulatory Visit: Payer: Self-pay

## 2019-08-13 ENCOUNTER — Ambulatory Visit (INDEPENDENT_AMBULATORY_CARE_PROVIDER_SITE_OTHER): Payer: No Typology Code available for payment source | Admitting: Urology

## 2019-08-13 ENCOUNTER — Encounter: Payer: Self-pay | Admitting: Urology

## 2019-08-13 VITALS — BP 143/82 | HR 69 | Temp 98.2°F | Ht 66.0 in | Wt 160.0 lb

## 2019-08-13 DIAGNOSIS — R351 Nocturia: Secondary | ICD-10-CM

## 2019-08-13 DIAGNOSIS — N400 Enlarged prostate without lower urinary tract symptoms: Secondary | ICD-10-CM

## 2019-08-13 LAB — POCT URINALYSIS DIPSTICK
Bilirubin, UA: NEGATIVE
Blood, UA: NEGATIVE
Glucose, UA: NEGATIVE
Ketones, UA: NEGATIVE
Leukocytes, UA: NEGATIVE
Nitrite, UA: NEGATIVE
Protein, UA: NEGATIVE
Spec Grav, UA: 1.02 (ref 1.010–1.025)
Urobilinogen, UA: NEGATIVE E.U./dL — AB
pH, UA: 7.5 (ref 5.0–8.0)

## 2019-08-13 MED ORDER — TAMSULOSIN HCL 0.4 MG PO CAPS: 0.4000 mg | ORAL_CAPSULE | Freq: Every day | ORAL | 3 refills | Status: DC

## 2019-08-13 NOTE — Patient Instructions (Signed)

## 2019-08-13 NOTE — Progress Notes (Signed)
08/13/2019 10:35 AM   Tony Vazquez 05/18/1970 [id number]  Referring provider: No referring provider defined for this encounter.  nocturia  HPI: Tony Vazquez is a 48yo here for followup for BPH with nocturia. Last visit in 03/2019 he was started on mirabegron 25mg  which did slightly improve his urinary urgency, frequency and nocturia.  Now he has nocturia 5-7x and urinaru frequency every 1 hour. No urinary incontinence. Stream strong    PMH: No past medical history on file.  Surgical History: No past surgical history on file.  Home Medications:  Allergies as of 08/13/2019   No Known Allergies     Medication List       Accurate as of August 13, 2019 10:35 AM. If you have any questions, ask your nurse or doctor.        alfuzosin 10 MG 24 hr tablet Commonly known as: UROXATRAL Take 1 tablet (10 mg total) by mouth daily with breakfast.   clobetasol 0.05 % external solution Commonly known as: TEMOVATE APPLY TO SCALP TWICE DAILY AS NEEDED (NOT TO FACE GROIN AND UNDERARMS)   ibuprofen 200 MG tablet Commonly known as: ADVIL Take 200 mg by mouth every 6 (six) hours as needed.   mirabegron ER 25 MG Tb24 tablet Commonly known as: MYRBETRIQ Take 1 tablet (25 mg total) by mouth daily.       Allergies: No Known Allergies  Family History: Family History  Problem Relation Age of Onset  . Benign prostatic hyperplasia Father     Social History:  reports that he has never smoked. He has never used smokeless tobacco. He reports previous alcohol use. No history on file for drug use.  ROS: All other review of systems were reviewed and are negative except what is noted above in HPI  Physical Exam: BP (!) 143/82   Pulse 69   Temp 98.2 F (36.8 C)   Ht 5\' 6"  (1.676 m)   Wt 160 lb (72.6 kg)   BMI 25.82 kg/m   Constitutional:  Alert and oriented, No acute distress. HEENT: Tony Vazquez, moist mucus membranes.  Trachea midline, no masses. Cardiovascular: No clubbing,  cyanosis, or edema. Respiratory: Normal respiratory effort, no increased work of breathing. GI: Abdomen is soft, nontender, nondistended, no abdominal masses GU: No CVA tenderness.  Lymph: No cervical or inguinal lymphadenopathy. Skin: No rashes, bruises or suspicious lesions. Neurologic: Grossly intact, no focal deficits, moving all 4 extremities. Psychiatric: Normal mood and affect.  Laboratory Data: Lab Results  Component Value Date   HGB 15.9 08/01/2007   HCT 44.9 08/01/2007    No results found for: CREATININE  No results found for: PSA  No results found for: TESTOSTERONE  No results found for: HGBA1C  Urinalysis    Component Value Date/Time   COLORURINE YELLOW 08/01/2007 1548   APPEARANCEUR CLEAR 08/01/2007 1548   LABSPEC >1.030 (H) 08/01/2007 1548   PHURINE 5.0 08/01/2007 1548   GLUCOSEU NEGATIVE 08/01/2007 1548   HGBUR NEGATIVE 08/01/2007 1548   BILIRUBINUR neg 08/13/2019 1002   KETONESUR NEGATIVE 08/01/2007 1548   PROTEINUR Negative 08/13/2019 1002   PROTEINUR NEGATIVE 08/01/2007 1548   UROBILINOGEN negative (A) 08/13/2019 1002   UROBILINOGEN 0.2 08/01/2007 1548   NITRITE neg 08/13/2019 1002   NITRITE NEGATIVE 08/01/2007 1548   LEUKOCYTESUR Negative 08/13/2019 1002    No results found for: LABMICR, WBCUA, RBCUA, LABEPIT, MUCUS, BACTERIA  Pertinent Imaging:  No results found for this or any previous visit.  No results found for this or  any previous visit.  No results found for this or any previous visit.  No results found for this or any previous visit.  Results for orders placed during the hospital encounter of 07/22/07  US Renal  Narrative Clinical Data: Right testicular pain  RENAL/URINARY TRACT ULTRASOUND  Technique:  Complete ultrasound examination of the urinary tract was performed including evaluation of the kidneys renal collecting systems and urinary bladder.  Comparison: None  Findings: Kidneys normal in size Vazquez 11.4 cm length  right and 11.1 cm length left. No renal mass, hydronephrosis, or shadowing calcification. Question minimal increase in renal cortical echogenicity versus medullary pyramids bilaterally, equivocal for presence of medical renal disease. Urinary bladder is partially distended without gross abnormality. Prostate gland appears mildly enlarged for age, approximately 4.0 x 4.3 x 2.9 cm.  IMPRESSION: Unremarkable renal ultrasound. Question minimally increased renal cortical echogenicity, which could be seen with medical renal disease, recommend correlation with renal function.  Provider: Vertell Novak  No results found for this or any previous visit.  No results found for this or any previous visit.  No results found for this or any previous visit.   Assessment & Plan:    1. Benign prostatic hyperplasia, unspecified whether lower urinary tract symptoms present -flomax 0.4mg  daily - POCT urinalysis dipstick  2. Nocturia -flomax 0.4mg   -toviaz 4mg      No follow-ups on file.  , MD  [facility] [place]

## 2019-08-13 NOTE — Progress Notes (Signed)
poc Urological Symptom Review  Patient is experiencing the following symptoms: Frequent urination Hard to postpone urination Get up at night to urinate Abdominal pain before urination x 2 wks.  Review of Systems  Gastrointestinal (upper)  : Negative for upper GI symptoms  Gastrointestinal (lower) : Negative for lower GI symptoms  Constitutional : Negative for symptoms  Skin: Negative for skin symptoms  Eyes: Negative for eye symptoms  Ear/Nose/Throat : Negative for Ear/Nose/Throat symptoms  Hematologic/Lymphatic: Negative for Hematologic/Lymphatic symptoms  Cardiovascular : Negative for cardiovascular symptoms  Respiratory : Negative for respiratory symptoms  Endocrine: Negative for endocrine symptoms  Musculoskeletal: Negative for musculoskeletal symptoms  Neurological: Negative for neurological symptoms  Psychologic: Negative for psychiatric symptoms

## 2019-09-17 ENCOUNTER — Ambulatory Visit (INDEPENDENT_AMBULATORY_CARE_PROVIDER_SITE_OTHER): Payer: No Typology Code available for payment source | Admitting: Urology

## 2019-09-17 ENCOUNTER — Encounter: Payer: Self-pay | Admitting: Urology

## 2019-09-17 VITALS — BP 119/78 | HR 60 | Temp 97.7°F | Ht 66.0 in | Wt 160.0 lb

## 2019-09-17 DIAGNOSIS — N400 Enlarged prostate without lower urinary tract symptoms: Secondary | ICD-10-CM | POA: Diagnosis not present

## 2019-09-17 DIAGNOSIS — R351 Nocturia: Secondary | ICD-10-CM

## 2019-09-17 LAB — URINALYSIS, ROUTINE W REFLEX MICROSCOPIC
Bilirubin, UA: NEGATIVE
Glucose, UA: NEGATIVE
Ketones, UA: NEGATIVE
Leukocytes,UA: NEGATIVE
Nitrite, UA: NEGATIVE
Protein,UA: NEGATIVE
Specific Gravity, UA: 1.01 (ref 1.005–1.030)
Urobilinogen, Ur: 0.2 mg/dL (ref 0.2–1.0)
pH, UA: 7 (ref 5.0–7.5)

## 2019-09-17 LAB — MICROSCOPIC EXAMINATION
Bacteria, UA: NONE SEEN
Epithelial Cells (non renal): NONE SEEN /hpf (ref 0–10)
Renal Epithel, UA: NONE SEEN /hpf
WBC, UA: NONE SEEN /hpf (ref 0–5)

## 2019-09-17 MED ORDER — TAMSULOSIN HCL 0.4 MG PO CAPS: 0.4000 mg | ORAL_CAPSULE | Freq: Every day | ORAL | 3 refills | Status: DC

## 2019-09-17 NOTE — Progress Notes (Signed)
Urological Symptom Review  Patient is experiencing the following symptoms: none   Review of Systems  Gastrointestinal (upper)  : Negative for upper GI symptoms  Gastrointestinal (lower) : Negative for upper GI symptoms  Constitutional : Negative for symptoms  Skin: Negative for skin symptoms  Eyes: Negative for eye symptoms  Ear/Nose/Throat : Negative for Ear/Nose/Throat symptoms  Hematologic/Lymphatic: Negative for Hematologic/Lymphatic symptoms  Cardiovascular : Negative for cardiovascular symptoms  Respiratory : Negative for respiratory symptoms  Endocrine: Negative for endocrine symptoms  Musculoskeletal: Negative for musculoskeletal symptoms  Neurological: Negative for neurological symptoms  Psychologic: Negative for psychiatric symptoms

## 2019-09-17 NOTE — Patient Instructions (Signed)

## 2019-09-17 NOTE — Progress Notes (Signed)
09/17/2019 8:53 AM   Tony Vazquez 05/03/1970 [id number]  Referring provider: No referring provider defined for this encounter.  Nocturia and BPH  HPI: Tony Vazquez is a 48yo here for followup for BPH and nocturia. Tony Vazquez did not help the nocturia of pelvic pressure. Flomax did improve his urinary stream, nocturia 1-2x, urinary frequnecy, urinary urgency and his pelvic pressure   PMH: No past medical history on file.  Surgical History: No past surgical history on file.  Home Medications:  Allergies as of 09/17/2019   No Known Allergies     Medication List       Accurate as of September 17, 2019  8:53 AM. If you have any questions, ask your nurse or doctor.        STOP taking these medications   alfuzosin 10 MG 24 hr tablet Commonly known as: UROXATRAL   clobetasol 0.05 % external solution Commonly known as: TEMOVATE     TAKE these medications   ibuprofen 200 MG tablet Commonly known as: ADVIL Take 200 mg by mouth every 6 (six) hours as needed.   mirabegron ER 25 MG Tb24 tablet Commonly known as: MYRBETRIQ Take 1 tablet (25 mg total) by mouth daily.   tamsulosin 0.4 MG Caps capsule Commonly known as: FLOMAX Take 1 capsule (0.4 mg total) by mouth daily after supper.       Allergies: No Known Allergies  Family History: Family History  Problem Relation Age of Onset  . Benign prostatic hyperplasia Father     Social History:  reports that he has never smoked. He has never used smokeless tobacco. He reports previous alcohol use. No history on file for drug use.  ROS: All other review of systems were reviewed and are negative except what is noted above in HPI  Physical Exam: BP 119/78   Pulse 60   Temp 97.7 F (36.5 C)   Ht 5\' 6"  (1.676 m)   Wt 160 lb (72.6 kg)   BMI 25.82 kg/m   Constitutional:  Alert and oriented, No acute distress. HEENT: [place] AT, moist mucus membranes.  Trachea midline, no masses. Cardiovascular: No clubbing, cyanosis, or  edema. Respiratory: Normal respiratory effort, no increased work of breathing. GI: Abdomen is soft, nontender, nondistended, no abdominal masses GU: No CVA tenderness.  Lymph: No cervical or inguinal lymphadenopathy. Skin: No rashes, bruises or suspicious lesions. Neurologic: Grossly intact, no focal deficits, moving all 4 extremities. Psychiatric: Normal mood and affect.  Laboratory Data: Lab Results  Component Value Date   HGB 15.9 08/01/2007   HCT 44.9 08/01/2007    No results found for: CREATININE  No results found for: PSA  No results found for: TESTOSTERONE  No results found for: HGBA1C  Urinalysis    Component Value Date/Time   COLORURINE YELLOW 08/01/2007 1548   APPEARANCEUR CLEAR 08/01/2007 1548   LABSPEC >1.030 (H) 08/01/2007 1548   PHURINE 5.0 08/01/2007 1548   GLUCOSEU NEGATIVE 08/01/2007 1548   HGBUR NEGATIVE 08/01/2007 1548   BILIRUBINUR neg 08/13/2019 1002   KETONESUR NEGATIVE 08/01/2007 1548   PROTEINUR Negative 08/13/2019 1002   PROTEINUR NEGATIVE 08/01/2007 1548   UROBILINOGEN negative (A) 08/13/2019 1002   UROBILINOGEN 0.2 08/01/2007 1548   NITRITE neg 08/13/2019 1002   NITRITE NEGATIVE 08/01/2007 1548   LEUKOCYTESUR Negative 08/13/2019 1002    No results found for: LABMICR, WBCUA, RBCUA, LABEPIT, MUCUS, BACTERIA  Pertinent Imaging:  No results found for this or any previous visit.  No results found for this or  any previous visit.  No results found for this or any previous visit.  No results found for this or any previous visit.  Results for orders placed during the hospital encounter of 07/22/07  US Renal  Narrative Clinical Data: Right testicular pain  RENAL/URINARY TRACT ULTRASOUND  Technique:  Complete ultrasound examination of the urinary tract was performed including evaluation of the kidneys renal collecting systems and urinary bladder.  Comparison: None  Findings: Kidneys normal in size at 11.4 cm length right and 11.1  cm length left. No renal mass, hydronephrosis, or shadowing calcification. Question minimal increase in renal cortical echogenicity versus medullary pyramids bilaterally, equivocal for presence of medical renal disease. Urinary bladder is partially distended without gross abnormality. Prostate gland appears mildly enlarged for age, approximately 4.0 x 4.3 x 2.9 cm.  IMPRESSION: Unremarkable renal ultrasound. Question minimally increased renal cortical echogenicity, which could be seen with medical renal disease, recommend correlation with renal function.  Provider: Vertell Novak  No results found for this or any previous visit.  No results found for this or any previous visit.  No results found for this or any previous visit.   Assessment & Plan:    1. Benign prostatic hyperplasia, unspecified whether lower urinary tract symptoms present Continue flomax - Urinalysis, Routine w reflex microscopic  2. Nocturia Continue flomax. RTC 3 months   No follow-ups on file.  Wilkie Aye, MD  [facility] [place]

## 2019-09-18 ENCOUNTER — Telehealth: Payer: Self-pay | Admitting: Urology

## 2019-09-18 NOTE — Telephone Encounter (Signed)
PTS CARETAKER CALLED AND STATES HE WAS SEEN YESTERDAY AND THE PHARMACY SAYS NO PRESCRIPTION WAS SENT. I SEE WHERE IT WAS SENT BUT SHE SAYS PHARMACY DOESN'T HAVE IT.

## 2019-09-18 NOTE — Telephone Encounter (Signed)
Spoke with pts sister. Explained to her that I called rx and they said pt had gotten a 90 day supply on July the 1st. Therefore ins would not pay for more until pt actually should need refill. Rx said they could pay out of pocket. Sister expressed understanding.

## 2019-10-23 ENCOUNTER — Ambulatory Visit (INDEPENDENT_AMBULATORY_CARE_PROVIDER_SITE_OTHER): Payer: No Typology Code available for payment source | Admitting: Urology

## 2019-10-23 ENCOUNTER — Encounter: Payer: Self-pay | Admitting: Urology

## 2019-10-23 ENCOUNTER — Other Ambulatory Visit: Payer: Self-pay

## 2019-10-23 VITALS — BP 124/76 | HR 71 | Temp 97.8°F | Ht 66.0 in | Wt 160.0 lb

## 2019-10-23 DIAGNOSIS — R351 Nocturia: Secondary | ICD-10-CM | POA: Diagnosis not present

## 2019-10-23 DIAGNOSIS — N400 Enlarged prostate without lower urinary tract symptoms: Secondary | ICD-10-CM

## 2019-10-23 DIAGNOSIS — R350 Frequency of micturition: Secondary | ICD-10-CM | POA: Insufficient documentation

## 2019-10-23 LAB — POCT URINALYSIS DIPSTICK
Bilirubin, UA: NEGATIVE
Glucose, UA: NEGATIVE
Ketones, UA: NEGATIVE
Leukocytes, UA: NEGATIVE
Nitrite, UA: NEGATIVE
Protein, UA: NEGATIVE
Urobilinogen, UA: 0.2 E.U./dL
pH, UA: 6 (ref 5.0–8.0)

## 2019-10-23 LAB — BLADDER SCAN AMB NON-IMAGING: Scan Result: 10

## 2019-10-23 NOTE — Progress Notes (Signed)
10/23/2019 11:20 AM   Tony Vazquez May 16, 1970 [id number]  Referring provider: No referring provider defined for this encounter.  Urinary frequency  HPI: Tony Vazquez is a 48yo here for followup for BPH and nocturia. Since last visit he has developed worsening nocturia 7-8x and urinary frequency every hour. He is currently on flomax 0.4mg  daily. He is having suprapubic pressure and pain. No other associated symtoms. Stream is strong   PMH: No past medical history on file.  Surgical History: No past surgical history on file.  Home Medications:  Allergies as of 10/23/2019   No Known Allergies     Medication List       Accurate as of October 23, 2019 11:20 AM. If you have any questions, ask your nurse or doctor.        ibuprofen 200 MG tablet Commonly known as: ADVIL Take 200 mg by mouth every 6 (six) hours as needed.   mirabegron ER 25 MG Tb24 tablet Commonly known as: MYRBETRIQ Take 1 tablet (25 mg total) by mouth daily.   tamsulosin 0.4 MG Caps capsule Commonly known as: FLOMAX Take 1 capsule (0.4 mg total) by mouth daily after supper.       Allergies: No Known Allergies  Family History: Family History  Problem Relation Age of Onset  . Benign prostatic hyperplasia Father     Social History:  reports that he has never smoked. He has never used smokeless tobacco. He reports previous alcohol use. No history on file for drug use.  ROS: All other review of systems were reviewed and are negative except what is noted above in HPI  Physical Exam: BP 124/76   Pulse 71   Temp 97.8 F (36.6 C)   Ht 5\' 6"  (1.676 m)   Wt 160 lb (72.6 kg)   BMI 25.82 kg/m   Constitutional:  Alert and oriented, No acute distress. HEENT: [place] AT, moist mucus membranes.  Trachea midline, no masses. Cardiovascular: No clubbing, cyanosis, or edema. Respiratory: Normal respiratory effort, no increased work of breathing. GI: Abdomen is soft, nontender, nondistended, no  abdominal masses GU: No CVA tenderness.  Lymph: No cervical or inguinal lymphadenopathy. Skin: No rashes, bruises or suspicious lesions. Neurologic: Grossly intact, no focal deficits, moving all 4 extremities. Psychiatric: Normal mood and affect.  Laboratory Data: Lab Results  Component Value Date   HGB 15.9 08/01/2007   HCT 44.9 08/01/2007    No results found for: CREATININE  No results found for: PSA  No results found for: TESTOSTERONE  No results found for: HGBA1C  Urinalysis    Component Value Date/Time   COLORURINE YELLOW 08/01/2007 1548   APPEARANCEUR Clear 09/17/2019 0841   LABSPEC >1.030 (H) 08/01/2007 1548   PHURINE 5.0 08/01/2007 1548   GLUCOSEU Negative 09/17/2019 0841   HGBUR NEGATIVE 08/01/2007 1548   BILIRUBINUR Negative 09/17/2019 0841   KETONESUR NEGATIVE 08/01/2007 1548   PROTEINUR Negative 09/17/2019 0841   PROTEINUR NEGATIVE 08/01/2007 1548   UROBILINOGEN negative (A) 08/13/2019 1002   UROBILINOGEN 0.2 08/01/2007 1548   NITRITE Negative 09/17/2019 0841   NITRITE NEGATIVE 08/01/2007 1548   LEUKOCYTESUR Negative 09/17/2019 0841    Lab Results  Component Value Date   LABMICR See below: 09/17/2019   WBCUA None seen 09/17/2019   LABEPIT None seen 09/17/2019   BACTERIA None seen 09/17/2019    Pertinent Imaging:  No results found for this or any previous visit.  No results found for this or any previous visit.  No results  found for this or any previous visit.  No results found for this or any previous visit.  Results for orders placed during the hospital encounter of 07/22/07  US Renal  Narrative Clinical Data: Right testicular pain  RENAL/URINARY TRACT ULTRASOUND  Technique:  Complete ultrasound examination of the urinary tract was performed including evaluation of the kidneys renal collecting systems and urinary bladder.  Comparison: None  Findings: Kidneys normal in size at 11.4 cm length right and 11.1 cm length left. No  renal mass, hydronephrosis, or shadowing calcification. Question minimal increase in renal cortical echogenicity versus medullary pyramids bilaterally, equivocal for presence of medical renal disease. Urinary bladder is partially distended without gross abnormality. Prostate gland appears mildly enlarged for age, approximately 4.0 x 4.3 x 2.9 cm.  IMPRESSION: Unremarkable renal ultrasound. Question minimally increased renal cortical echogenicity, which could be seen with medical renal disease, recommend correlation with renal function.  Provider: Vertell Novak  No results found for this or any previous visit.  No results found for this or any previous visit.  No results found for this or any previous visit.   Assessment & Plan:    1. Benign prostatic hyperplasia, unspecified whether lower urinary tract symptoms present -continue flomax - POCT urinalysis dipstick - BLADDER SCAN AMB NON-IMAGING  2. Nocturia -We will trial mirabegron 25mg  daily  3. Urinary frequency -Mirabrgron 25mg  daily   No follow-ups on file.  , MD  [facility] [place]

## 2019-10-23 NOTE — Patient Instructions (Signed)

## 2019-10-23 NOTE — Progress Notes (Signed)
Urological Symptom Review  Patient is experiencing the following symptoms: Frequent urination Hard to postpone urination Get up at night to urinate   Review of Systems  Gastrointestinal (upper)  : Negative for upper GI symptoms  Gastrointestinal (lower) : Negative for lower GI symptoms  Constitutional : Negative for symptoms  Skin: Negative for skin symptoms  Eyes: Negative for eye symptoms  Ear/Nose/Throat : Negative for Ear/Nose/Throat symptoms  Hematologic/Lymphatic: Negative for Hematologic/Lymphatic symptoms  Cardiovascular : Negative for cardiovascular symptoms  Respiratory : Negative for respiratory symptoms  Endocrine: Negative for endocrine symptoms  Musculoskeletal: Negative for musculoskeletal symptoms  Neurological: Negative for neurological symptoms  Psychologic: Negative for psychiatric symptoms  

## 2019-12-02 ENCOUNTER — Ambulatory Visit (INDEPENDENT_AMBULATORY_CARE_PROVIDER_SITE_OTHER): Payer: No Typology Code available for payment source | Admitting: Urology

## 2019-12-02 ENCOUNTER — Encounter: Payer: Self-pay | Admitting: Urology

## 2019-12-02 ENCOUNTER — Other Ambulatory Visit: Payer: Self-pay

## 2019-12-02 VITALS — BP 144/82 | HR 60 | Temp 97.8°F | Ht 66.0 in | Wt 160.0 lb

## 2019-12-02 DIAGNOSIS — R350 Frequency of micturition: Secondary | ICD-10-CM

## 2019-12-02 DIAGNOSIS — N400 Enlarged prostate without lower urinary tract symptoms: Secondary | ICD-10-CM

## 2019-12-02 DIAGNOSIS — R351 Nocturia: Secondary | ICD-10-CM | POA: Diagnosis not present

## 2019-12-02 LAB — URINALYSIS, ROUTINE W REFLEX MICROSCOPIC
Bilirubin, UA: NEGATIVE
Glucose, UA: NEGATIVE
Ketones, UA: NEGATIVE
Leukocytes,UA: NEGATIVE
Nitrite, UA: NEGATIVE
Protein,UA: NEGATIVE
RBC, UA: NEGATIVE
Specific Gravity, UA: 1.025 (ref 1.005–1.030)
Urobilinogen, Ur: 0.2 mg/dL (ref 0.2–1.0)
pH, UA: 7 (ref 5.0–7.5)

## 2019-12-02 MED ORDER — TAMSULOSIN HCL 0.4 MG PO CAPS: 0.4000 mg | ORAL_CAPSULE | Freq: Every day | ORAL | 3 refills | Status: AC

## 2019-12-02 MED ORDER — FESOTERODINE FUMARATE ER 4 MG PO TB24: 4.0000 mg | ORAL_TABLET | Freq: Every day | ORAL | 0 refills | Status: AC

## 2019-12-02 NOTE — Progress Notes (Signed)
12/02/2019 8:42 AM   Tony Vazquez [birth year] [id number]  Referring provider: No referring provider defined for this encounter.  followup nocturia and urinary urgency  HPI: Tony Vazquez is a 48yo here for followup for BPH, urinary frequency, and nocturia. He is on flomax at baseline and has a good stream. He took mirabegron which caused severe diarrhea. His nocturia is 2-3 if he does not drink before bed. Other nights he has nocturia 7x.    PMH: No past medical history on file.  Surgical History: No past surgical history on file.  Home Medications:  Allergies as of 12/02/2019   No Known Allergies     Medication List       Accurate as of December 02, 2019  8:42 AM. If you have any questions, ask your nurse or doctor.        ibuprofen 200 MG tablet Commonly known as: ADVIL Take 200 mg by mouth every 6 (six) hours as needed.   mirabegron ER 25 MG Tb24 tablet Commonly known as: MYRBETRIQ Take 1 tablet (25 mg total) by mouth daily.   tamsulosin 0.4 MG Caps capsule Commonly known as: FLOMAX Take 1 capsule (0.4 mg total) by mouth daily after supper.       Allergies: No Known Allergies  Family History: Family History  Problem Relation Age of Onset  . Benign prostatic hyperplasia Father     Social History:  reports that he has never smoked. He has never used smokeless tobacco. He reports previous alcohol use. No history on file for drug use.  ROS: All other review of systems were reviewed and are negative except what is noted above in HPI  Physical Exam: BP (!) 144/82   Pulse 60   Temp 97.8 F (36.6 C)   Ht 5\' 6"  (1.676 m)   Wt 160 lb (72.6 kg)   BMI 25.82 kg/m   Constitutional:  Alert and oriented, No acute distress. HEENT: [place] AT, moist mucus membranes.  Trachea midline, no masses. Cardiovascular: No clubbing, cyanosis, or edema. Respiratory: Normal respiratory effort, no increased work of breathing. GI: Abdomen is soft, nontender,  nondistended, no abdominal masses GU: No CVA tenderness.  Lymph: No cervical or inguinal lymphadenopathy. Skin: No rashes, bruises or suspicious lesions. Neurologic: Grossly intact, no focal deficits, moving all 4 extremities. Psychiatric: Normal mood and affect.  Laboratory Data: Lab Results  Component Value Date   HGB 15.9 08/01/2007   HCT 44.9 08/01/2007    No results found for: CREATININE  No results found for: PSA  No results found for: TESTOSTERONE  No results found for: HGBA1C  Urinalysis    Component Value Date/Time   COLORURINE YELLOW 08/01/2007 1548   APPEARANCEUR Clear 09/17/2019 0841   LABSPEC >1.030 (H) 08/01/2007 1548   PHURINE 5.0 08/01/2007 1548   GLUCOSEU Negative 09/17/2019 0841   HGBUR NEGATIVE 08/01/2007 1548   BILIRUBINUR negative 10/23/2019 1139   BILIRUBINUR Negative 09/17/2019 0841   KETONESUR NEGATIVE 08/01/2007 1548   PROTEINUR Negative 10/23/2019 1139   PROTEINUR Negative 09/17/2019 0841   PROTEINUR NEGATIVE 08/01/2007 1548   UROBILINOGEN 0.2 10/23/2019 1139   UROBILINOGEN 0.2 08/01/2007 1548   NITRITE negative 10/23/2019 1139   NITRITE Negative 09/17/2019 0841   NITRITE NEGATIVE 08/01/2007 1548   LEUKOCYTESUR Negative 10/23/2019 1139   LEUKOCYTESUR Negative 09/17/2019 0841    Lab Results  Component Value Date   LABMICR See below: 09/17/2019   WBCUA None seen 09/17/2019   LABEPIT None seen 09/17/2019   BACTERIA  None seen 09/17/2019    Pertinent Imaging:  No results found for this or any previous visit.  No results found for this or any previous visit.  No results found for this or any previous visit.  No results found for this or any previous visit.  Results for orders placed during the hospital encounter of 07/22/07  US Renal  Narrative Clinical Data: Right testicular pain  RENAL/URINARY TRACT ULTRASOUND  Technique:  Complete ultrasound examination of the urinary tract was performed including evaluation of the  kidneys renal collecting systems and urinary bladder.  Comparison: None  Findings: Kidneys normal in size at 11.4 cm length right and 11.1 cm length left. No renal mass, hydronephrosis, or shadowing calcification. Question minimal increase in renal cortical echogenicity versus medullary pyramids bilaterally, equivocal for presence of medical renal disease. Urinary bladder is partially distended without gross abnormality. Prostate gland appears mildly enlarged for age, approximately 4.0 x 4.3 x 2.9 cm.  IMPRESSION: Unremarkable renal ultrasound. Question minimally increased renal cortical echogenicity, which could be seen with medical renal disease, recommend correlation with renal function.  Provider: Vertell Novak  No results found for this or any previous visit.  No results found for this or any previous visit.  No results found for this or any previous visit.   Assessment & Plan:    1. Benign prostatic hyperplasia, unspecified whether lower urinary tract symptoms present -continue flomax - BLADDER SCAN AMB NON-IMAGING - Urinalysis, Routine w reflex microscopic  2. Nocturia -continue flomax  3. Urinary frequency -we will trial toviaz 4mg    No follow-ups on file.  , MD  [facility] [place]

## 2019-12-02 NOTE — Patient Instructions (Signed)

## 2019-12-02 NOTE — Progress Notes (Signed)
Urological Symptom Review  Patient is experiencing the following symptoms: Frequent urination Get up at night to urinate   Review of Systems  Gastrointestinal (upper)  : Nausea  Gastrointestinal (lower) : Diarrhea  Constitutional : Negative for symptoms  Skin: Negative for skin symptoms  Eyes: Negative for eye symptoms  Ear/Nose/Throat : Negative for Ear/Nose/Throat symptoms  Hematologic/Lymphatic: Negative for Hematologic/Lymphatic symptoms  Cardiovascular : Negative for cardiovascular symptoms  Respiratory : Negative for respiratory symptoms  Endocrine: Negative for endocrine symptoms  Musculoskeletal: Negative for musculoskeletal symptoms  Neurological: Negative for neurological symptoms  Psychologic: Negative for psychiatric symptoms

## 2019-12-21 ENCOUNTER — Ambulatory Visit: Payer: No Typology Code available for payment source | Admitting: Urology

## 2020-01-15 ENCOUNTER — Ambulatory Visit: Payer: No Typology Code available for payment source | Admitting: Urology

## 2020-01-20 ENCOUNTER — Other Ambulatory Visit: Payer: Self-pay

## 2020-01-20 ENCOUNTER — Encounter: Payer: Self-pay | Admitting: Urology

## 2020-01-20 ENCOUNTER — Ambulatory Visit (INDEPENDENT_AMBULATORY_CARE_PROVIDER_SITE_OTHER): Payer: No Typology Code available for payment source | Admitting: Urology

## 2020-01-20 VITALS — BP 128/84 | HR 59 | Temp 98.0°F | Ht 66.0 in | Wt 160.0 lb

## 2020-01-20 DIAGNOSIS — R350 Frequency of micturition: Secondary | ICD-10-CM | POA: Diagnosis not present

## 2020-01-20 DIAGNOSIS — N400 Enlarged prostate without lower urinary tract symptoms: Secondary | ICD-10-CM | POA: Diagnosis not present

## 2020-01-20 DIAGNOSIS — N411 Chronic prostatitis: Secondary | ICD-10-CM | POA: Insufficient documentation

## 2020-01-20 DIAGNOSIS — R351 Nocturia: Secondary | ICD-10-CM

## 2020-01-20 LAB — URINALYSIS, ROUTINE W REFLEX MICROSCOPIC
Bilirubin, UA: NEGATIVE
Glucose, UA: NEGATIVE
Ketones, UA: NEGATIVE
Leukocytes,UA: NEGATIVE
Nitrite, UA: NEGATIVE
Protein,UA: NEGATIVE
RBC, UA: NEGATIVE
Specific Gravity, UA: 1.02 (ref 1.005–1.030)
Urobilinogen, Ur: 0.2 mg/dL (ref 0.2–1.0)
pH, UA: 7.5 (ref 5.0–7.5)

## 2020-01-20 LAB — BLADDER SCAN AMB NON-IMAGING: Scan Result: 43

## 2020-01-20 MED ORDER — DOXYCYCLINE HYCLATE 100 MG PO CAPS: 100.0000 mg | ORAL_CAPSULE | Freq: Two times a day (BID) | ORAL | 0 refills | Status: AC

## 2020-01-20 NOTE — Progress Notes (Signed)
Urological Symptom Review  Patient is experiencing the following symptoms: Frequent urination Hard to postpone urination Get up at night to urinate   Review of Systems  Gastrointestinal (upper)  : Negative for upper GI symptoms  Gastrointestinal (lower) : Negative for lower GI symptoms  Constitutional : Negative for symptoms  Skin: Negative for skin symptoms  Eyes: Negative for eye symptoms  Ear/Nose/Throat : Negative for Ear/Nose/Throat symptoms  Hematologic/Lymphatic: Negative for Hematologic/Lymphatic symptoms  Cardiovascular : Negative for cardiovascular symptoms  Respiratory : Negative for respiratory symptoms  Endocrine: None  Musculoskeletal: Negative for musculoskeletal symptoms  Neurological: Negative for neurological symptoms  Psychologic: Negative for psychiatric symptoms

## 2020-01-20 NOTE — Patient Instructions (Signed)
Prostatitis °Prostatitis ° °La prostatitis es la hinchazón de la próstata. La próstata ayuda a producir el semen. Se encuentra debajo de la vejiga del hombre, frente al recto. Hay distintos tipos de prostatitis. °Siga estas indicaciones en su casa: ° °· Tome los medicamentos de venta libre y los recetados solamente como se lo haya indicado el médico. °· Si le recetaron un antibiótico, tómelo como se lo haya indicado el médico. No deje de tomar los antibióticos aunque comience a sentirse mejor. °· Si el médico le prescribió ejercicios, hágalos según las indicaciones. °· Tome baños de asiento como se lo haya indicado el médico. Para tomar un baño de asiento, siéntese en agua tibia que le cubra las caderas y las nalgas. °· Concurra a todas las visitas de control como se lo haya indicado el médico. Esto es importante. °Comuníquese con un médico si: °· Los síntomas empeoran. °· Tiene fiebre. °Solicite ayuda de inmediato si: °· Tiene escalofríos. °· Siente malestar estomacal (náuseas). °· Vomita. °· Siente que va a desvanecerse. °· Siente que podría desvanecerse (desmayarse). °· No puede hacer pis (orinar). °· Tiene sangre o grumos de sangre (coágulos de sangre) en el pis (orina). °Esta información no tiene como fin reemplazar el consejo del médico. Asegúrese de hacerle al médico cualquier pregunta que tenga. °Document Revised: 10/10/2016 Document Reviewed: 08/04/2015 °Elsevier Patient Education © 2020 [employer] ° °

## 2020-01-20 NOTE — Progress Notes (Signed)
01/20/2020 9:19 AM   Tony Vazquez February 25, 1970 [id number]  Referring provider: No referring provider defined for this encounter.  followup BPH and nocturia  HPI: [name] is a 49yo her for followup for BPH, nocturia and urinary frequency. Last visit he was started on toviaz which failed to improve his urinary frequency and nocturia. He remains on flomax 0.4mg  daily. He is having new pelvic pain which is partially relieved with urinating. He has urinary urgency but rare urge incontinence. No fevers. Stream fair. No hematuria.    PMH: History reviewed. No pertinent past medical history.  Surgical History: History reviewed. No pertinent surgical history.  Home Medications:  Allergies as of 01/20/2020   No Known Allergies     Medication List       Accurate as of January 20, 2020  9:19 AM. If you have any questions, ask your nurse or doctor.        fesoterodine 4 MG Tb24 tablet Commonly known as: TOVIAZ Take 1 tablet (4 mg total) by mouth daily.   ibuprofen 200 MG tablet Commonly known as: ADVIL Take 200 mg by mouth every 6 (six) hours as needed.   mirabegron ER 25 MG Tb24 tablet Commonly known as: MYRBETRIQ Take 1 tablet (25 mg total) by mouth daily.   tamsulosin 0.4 MG Caps capsule Commonly known as: FLOMAX Take 1 capsule (0.4 mg total) by mouth daily after supper.       Allergies: No Known Allergies  Family History: Family History  Problem Relation Age of Onset  . Benign prostatic hyperplasia Father     Social History:  reports that he has never smoked. He has never used smokeless tobacco. He reports previous alcohol use. No history on file for drug use.  ROS: All other review of systems were reviewed and are negative except what is noted above in HPI  Physical Exam: BP 128/84   Pulse (!) 59   Temp 98 F (36.7 C)   Ht 5\' 6"  (1.676 m)   Wt 160 lb (72.6 kg)   BMI 25.82 kg/m   Constitutional:  Alert and oriented, No acute  distress. HEENT: [place] AT, moist mucus membranes.  Trachea midline, no masses. Cardiovascular: No clubbing, cyanosis, or edema. Respiratory: Normal respiratory effort, no increased work of breathing. GI: Abdomen is soft, nontender, nondistended, no abdominal masses GU: No CVA tenderness.  Lymph: No cervical or inguinal lymphadenopathy. Skin: No rashes, bruises or suspicious lesions. Neurologic: Grossly intact, no focal deficits, moving all 4 extremities. Psychiatric: Normal mood and affect.  Laboratory Data: Lab Results  Component Value Date   HGB 15.9 08/01/2007   HCT 44.9 08/01/2007    No results found for: CREATININE  No results found for: PSA  No results found for: TESTOSTERONE  No results found for: HGBA1C  Urinalysis    Component Value Date/Time   COLORURINE YELLOW 08/01/2007 1548   APPEARANCEUR Clear 01/20/2020 0844   LABSPEC >1.030 (H) 08/01/2007 1548   PHURINE 5.0 08/01/2007 1548   GLUCOSEU Negative 01/20/2020 0844   HGBUR NEGATIVE 08/01/2007 1548   BILIRUBINUR Negative 01/20/2020 0844   KETONESUR NEGATIVE 08/01/2007 1548   PROTEINUR Negative 01/20/2020 0844   PROTEINUR NEGATIVE 08/01/2007 1548   UROBILINOGEN 0.2 10/23/2019 1139   UROBILINOGEN 0.2 08/01/2007 1548   NITRITE Negative 01/20/2020 0844   NITRITE NEGATIVE 08/01/2007 1548   LEUKOCYTESUR Negative 01/20/2020 0844    Lab Results  Component Value Date   LABMICR Comment 01/20/2020   WBCUA None seen 09/17/2019  LABEPIT None seen 09/17/2019   BACTERIA None seen 09/17/2019    Pertinent Imaging:  No results found for this or any previous visit.  No results found for this or any previous visit.  No results found for this or any previous visit.  No results found for this or any previous visit.  Results for orders placed during the hospital encounter of 07/22/07  US Renal  Narrative Clinical Data: Right testicular pain  RENAL/URINARY TRACT ULTRASOUND  Technique:  Complete ultrasound  examination of the urinary tract was performed including evaluation of the kidneys renal collecting systems and urinary bladder.  Comparison: None  Findings: Kidneys normal in size at 11.4 cm length right and 11.1 cm length left. No renal mass, hydronephrosis, or shadowing calcification. Question minimal increase in renal cortical echogenicity versus medullary pyramids bilaterally, equivocal for presence of medical renal disease. Urinary bladder is partially distended without gross abnormality. Prostate gland appears mildly enlarged for age, approximately 4.0 x 4.3 x 2.9 cm.  IMPRESSION: Unremarkable renal ultrasound. Question minimally increased renal cortical echogenicity, which could be seen with medical renal disease, recommend correlation with renal function.  Provider: Vertell Novak  No results found for this or any previous visit.  No results found for this or any previous visit.  No results found for this or any previous visit.   Assessment & Plan:    1. Benign prostatic hyperplasia, unspecified whether lower urinary tract symptoms present -continue flomax - Urinalysis, Routine w reflex microscopic - Bladder Scan (Post Void Residual) in office  2. Nocturia -continue flomax   3. Chronic prostatitis Doxycycline 100mg  BID for 28 days   Return in about 6 weeks (around 03/02/2020).  03/04/2020, MD  [facility] [place]

## 2020-03-02 ENCOUNTER — Ambulatory Visit: Payer: No Typology Code available for payment source | Admitting: Urology

## 2020-03-02 DIAGNOSIS — N400 Enlarged prostate without lower urinary tract symptoms: Secondary | ICD-10-CM

## 2020-04-20 ENCOUNTER — Ambulatory Visit: Payer: No Typology Code available for payment source | Admitting: Urology

## 2020-04-20 DIAGNOSIS — N400 Enlarged prostate without lower urinary tract symptoms: Secondary | ICD-10-CM
